# Patient Record
Sex: Male | Born: 1980 | Race: White | Hispanic: No | Marital: Married | Smoking: Former smoker
Health system: Southern US, Community
[De-identification: ages and names within clinical notes are randomized; demographics above are authoritative.]

## PROBLEM LIST (undated history)

## (undated) DIAGNOSIS — F419 Anxiety disorder, unspecified: Secondary | ICD-10-CM

## (undated) DIAGNOSIS — E079 Disorder of thyroid, unspecified: Secondary | ICD-10-CM

## (undated) DIAGNOSIS — J45909 Unspecified asthma, uncomplicated: Secondary | ICD-10-CM

## (undated) HISTORY — DX: Anxiety disorder, unspecified: F41.9

## (undated) HISTORY — PX: CHOLECYSTECTOMY: SHX55

## (undated) HISTORY — PX: KNEE SURGERY: SHX244

---

## 2003-01-04 ENCOUNTER — Ambulatory Visit (HOSPITAL_COMMUNITY): Admission: RE | Admit: 2003-01-04 | Discharge: 2003-01-04 | Payer: Self-pay | Admitting: Cardiology

## 2004-10-05 ENCOUNTER — Ambulatory Visit (HOSPITAL_COMMUNITY): Admission: RE | Admit: 2004-10-05 | Discharge: 2004-10-05 | Payer: Self-pay | Admitting: Internal Medicine

## 2004-10-13 ENCOUNTER — Ambulatory Visit: Payer: Self-pay | Admitting: Internal Medicine

## 2004-10-21 ENCOUNTER — Observation Stay (HOSPITAL_COMMUNITY): Admission: RE | Admit: 2004-10-21 | Discharge: 2004-10-22 | Payer: Self-pay | Admitting: General Surgery

## 2008-06-20 ENCOUNTER — Ambulatory Visit (HOSPITAL_COMMUNITY): Admission: RE | Admit: 2008-06-20 | Discharge: 2008-06-20 | Payer: Self-pay | Admitting: Family Medicine

## 2011-04-23 NOTE — Op Note (Signed)
NAME:  Derek Carson, Derek Carson NO.:  [id number]   MEDICAL RECORD NO.:  [id number]          PATIENT TYPE:  AMB   LOCATION:  DAY                           FACILITY:  [facility]   PHYSICIAN:  Barbaraann Barthel, M.D. DATE OF BIRTH:  02/02/1981   DATE OF PROCEDURE:  10/21/2004  DATE OF DISCHARGE:                                 OPERATIVE REPORT   PREOPERATIVE DIAGNOSIS:  Cholecystitis secondary to biliary dyskinesia.   POSTOPERATIVE DIAGNOSIS:  Cholecystitis secondary to biliary dyskinesia.   PROCEDURE:  Laparoscopic cholecystectomy.   SURGEON:  Barbaraann Barthel, M.D.   SPECIMENS:  Gallbladder.   NOTE:  This is a 30 year old white male who was referred from the GI service  for continuing right upper quadrant postprandial pain with nausea and  occasional bouts of vomiting as well.  He had undergone a thorough upper GI  evaluation including endoscopy, sonogram, and hepatobiliary scan.  The  suggestion was with a low ejection fraction to be biliary dyskinesia.  We  had tried diet manipulation and a nonoperative approach.  This had failed.  He was desirous of surgery.   We discussed complications not limited to but including bleeding, infection,  damage to bile ducts, perforation of organs, transitory diarrhea, and the  possibility that he still might have some dyspepsia as the result for  cholecystectomy for biliary dyskinesia are not as satisfying as for  cholecystectomy for cholelithiasis.  This was discussed in detail, and  informed consent was obtained.   GROSS OPERATIVE FINDINGS:  The patient had moderate adhesions around the  gallbladder, a small cystic duct which was not cannulated.  No stones noted  within the gallbladder.  No other abnormalities encountered in the right  upper quadrant.   TECHNIQUE:  The patient was placed in the supine position.  After the  adequate administration of general anesthesia by endotracheal intubation,  his entire abdomen was prepped with  Betadine solution and draped in the  usual manner.  Prior to this, a Foley catheter was aseptically inserted.  A  periumbilical incision was carried out over the superior aspect of the  umbilicus.  The fascia was grasped with a sharp towel clip and elevated, and  a Veress needle was inserted and confirmed the position with a saline drop  test.  We then used a Visiport technique to place the 11 mm cannula in the  umbilicus, and then under direct vision we placed three other cannulas, an  11 mm cannula in the epigastrium and two 5 mm cannulas in the right upper  quadrant laterally.  The gallbladder was grasped, its adhesions were taken  down.  The cystic duct was clearly visualized, as was the cystic artery.  These were doubly clamped, and the cystic duct and the cystic artery were  then after being identified and clamped, divided.  The gallbladder was then  removed uneventfully using the hook cautery device.  Prior to closure all  sponge, needle, and instrument counts were found to be correct.  We removed  the gallbladder using the hook cautery device using an Endosac device to  remove this through the  epigastric incision.  We then checked for hemostasis  and after irrigating and checking for hemostasis, I elected to leave a piece  of Surgicel in the liver bed and the abdomen was then desufflated and the 11  mm cannula sites were closed with an 0 Polysorb and we used 0.5% Sensorcaine  to infiltrate to help with postoperative discomfort.  Surgiclips were then  used to approximate the skin.  Prior to closure all sponge, needle, and  instrument counts were found to be correct.  Estimated blood loss was  minimal.  The patient received 1500 mL of crystalloids intraoperatively.  No  drains were placed.  There were no complications.     Will   WB/MEDQ  D:  10/21/2004  T:  10/21/2004  Job:  [id number]   cc:   R. Roetta Sessions, M.D.  [address]  [place]  [place] [address]  Fax: [phone]

## 2013-10-01 ENCOUNTER — Other Ambulatory Visit (HOSPITAL_COMMUNITY)
Admission: RE | Admit: 2013-10-01 | Discharge: 2013-10-01 | Disposition: A | Discharge status: Home / Self Care | Payer: BC Managed Care – PPO | Source: Ambulatory Visit | Attending: Urology | Admitting: Urology

## 2013-10-01 DIAGNOSIS — Z9852 Vasectomy status: Secondary | ICD-10-CM | POA: Insufficient documentation

## 2018-05-31 DIAGNOSIS — Z6839 Body mass index (BMI) 39.0-39.9, adult: Secondary | ICD-10-CM | POA: Diagnosis not present

## 2018-05-31 DIAGNOSIS — E039 Hypothyroidism, unspecified: Secondary | ICD-10-CM | POA: Diagnosis not present

## 2018-05-31 DIAGNOSIS — Z6841 Body Mass Index (BMI) 40.0 and over, adult: Secondary | ICD-10-CM | POA: Diagnosis not present

## 2018-05-31 DIAGNOSIS — R42 Dizziness and giddiness: Secondary | ICD-10-CM | POA: Diagnosis not present

## 2018-07-10 DIAGNOSIS — E039 Hypothyroidism, unspecified: Secondary | ICD-10-CM | POA: Diagnosis not present

## 2019-02-07 ENCOUNTER — Encounter (HOSPITAL_COMMUNITY): Payer: Self-pay

## 2019-02-07 ENCOUNTER — Emergency Department (HOSPITAL_COMMUNITY)
Admission: EM | Admit: 2019-02-07 | Discharge: 2019-02-07 | Disposition: A | Discharge status: Home / Self Care | Payer: BLUE CROSS/BLUE SHIELD | Attending: Emergency Medicine | Admitting: Emergency Medicine

## 2019-02-07 ENCOUNTER — Emergency Department (HOSPITAL_COMMUNITY): Payer: BLUE CROSS/BLUE SHIELD

## 2019-02-07 ENCOUNTER — Other Ambulatory Visit: Payer: Self-pay

## 2019-02-07 DIAGNOSIS — R42 Dizziness and giddiness: Secondary | ICD-10-CM | POA: Diagnosis not present

## 2019-02-07 DIAGNOSIS — E039 Hypothyroidism, unspecified: Secondary | ICD-10-CM | POA: Insufficient documentation

## 2019-02-07 DIAGNOSIS — Z79899 Other long term (current) drug therapy: Secondary | ICD-10-CM | POA: Insufficient documentation

## 2019-02-07 DIAGNOSIS — H814 Vertigo of central origin: Secondary | ICD-10-CM | POA: Diagnosis not present

## 2019-02-07 HISTORY — DX: Disorder of thyroid, unspecified: E07.9

## 2019-02-07 LAB — CBC WITH DIFFERENTIAL/PLATELET
Abs Immature Granulocytes: 0.01 10*3/uL (ref 0.00–0.07)
Basophils Absolute: 0 10*3/uL (ref 0.0–0.1)
Basophils Relative: 1 %
Eosinophils Absolute: 0.1 10*3/uL (ref 0.0–0.5)
Eosinophils Relative: 2 %
HCT: 46.1 % (ref 39.0–52.0)
Hemoglobin: 14.8 g/dL (ref 13.0–17.0)
Immature Granulocytes: 0 %
Lymphocytes Relative: 24 %
Lymphs Abs: 1.4 10*3/uL (ref 0.7–4.0)
MCH: 28.2 pg (ref 26.0–34.0)
MCHC: 32.1 g/dL (ref 30.0–36.0)
MCV: 88 fL (ref 80.0–100.0)
Monocytes Absolute: 0.5 10*3/uL (ref 0.1–1.0)
Monocytes Relative: 8 %
Neutro Abs: 3.7 10*3/uL (ref 1.7–7.7)
Neutrophils Relative %: 65 %
Platelets: 376 10*3/uL (ref 150–400)
RBC: 5.24 MIL/uL (ref 4.22–5.81)
RDW: 13.5 % (ref 11.5–15.5)
WBC: 5.6 10*3/uL (ref 4.0–10.5)
nRBC: 0 % (ref 0.0–0.2)

## 2019-02-07 LAB — COMPREHENSIVE METABOLIC PANEL
ALT: 26 U/L (ref 0–44)
AST: 20 U/L (ref 15–41)
Albumin: 3.9 g/dL (ref 3.5–5.0)
Alkaline Phosphatase: 92 U/L (ref 38–126)
Anion gap: 7 (ref 5–15)
BUN: 14 mg/dL (ref 6–20)
CO2: 23 mmol/L (ref 22–32)
Calcium: 8.8 mg/dL — ABNORMAL LOW (ref 8.9–10.3)
Chloride: 107 mmol/L (ref 98–111)
Creatinine, Ser: 0.81 mg/dL (ref 0.61–1.24)
GFR calc Af Amer: >60 mL/min (ref >60)
GFR calc non Af Amer: >60 mL/min (ref >60)
Glucose, Bld: 123 mg/dL — ABNORMAL HIGH (ref 70–99)
Potassium: 4.1 mmol/L (ref 3.5–5.1)
Sodium: 137 mmol/L (ref 135–145)
Total Bilirubin: 0.6 mg/dL (ref 0.3–1.2)
Total Protein: 7 g/dL (ref 6.5–8.1)

## 2019-02-07 LAB — TSH: TSH: 3.316 u[IU]/mL (ref 0.350–4.500)

## 2019-02-07 LAB — TROPONIN I: Troponin I: <0.03 ng/mL (ref <0.03)

## 2019-02-07 MED ORDER — MECLIZINE HCL 12.5 MG PO TABS
25.0000 mg | ORAL_TABLET | Freq: Once | ORAL | Status: AC
  Administered 2019-02-07: 25 mg via ORAL
  Filled 2019-02-07: qty 2

## 2019-02-07 MED ORDER — ONDANSETRON 4 MG PO TBDP
4.0000 mg | ORAL_TABLET | Freq: Once | ORAL | Status: AC
  Administered 2019-02-07: 4 mg via ORAL
  Filled 2019-02-07: qty 1

## 2019-02-07 MED ORDER — MECLIZINE HCL 25 MG PO TABS: 25.0000 mg | ORAL_TABLET | Freq: Three times a day (TID) | ORAL | 0 refills | Status: DC | PRN

## 2019-02-07 NOTE — ED Triage Notes (Signed)
Pt reports he woke up 545 am and was dizzy and dizziness has gotten worse. Feels like room spinning. Denies HA  Feels nauseated. Wife reports that he had difficulty telling registration his DOB and mothers phone number

## 2019-02-07 NOTE — Discharge Instructions (Signed)
Return if any problems.

## 2019-02-07 NOTE — ED Provider Notes (Signed)
[facility] EMERGENCY DEPARTMENT Provider Note   CSN: [id number] Arrival date & time: 02/07/19  1157    History   Chief Complaint Chief Complaint  Patient presents with  . Dizziness    HPI Derek Carson is a 38 y.o. male.     The history is provided by the patient. No language interpreter was used.  Dizziness  Quality:  Lightheadedness, vertigo and room spinning Severity:  Severe Onset quality:  Sudden Duration:  4 hours Timing:  Constant Progression:  Worsening Chronicity:  Recurrent Relieved by:  Nothing Worsened by:  Nothing Ineffective treatments:  None tried Associated symptoms: nausea   Associated symptoms: no chest pain   Risk factors: no new medications   Pt reports he awoke this morning with dizziness.  Pt reports he had similar in the past and his thyroid was low.  Pt reports symptoms resolved.  Wife reports pt has had difficulty with communicating this am.  Pt could not remember his Mothers phone number.  He could not give registration his date of birth.   Past Medical History:  Diagnosis Date  . Thyroid disease    hypothyroid    There are no active problems to display for this patient.   History reviewed. No pertinent surgical history.      Home Medications    Prior to Admission medications   Medication Sig Start Date End Date Taking? Authorizing Provider  levothyroxine (SYNTHROID, LEVOTHROID) 88 MCG tablet Take 1 mcg by mouth daily.  01/19/19  Yes [provider]    Family History No family history on file.  Social History Social History   Tobacco Use  . Smoking status: Never Smoker  Substance Use Topics  . Alcohol use: Not Currently  . Drug use: Never     Allergies   Codeine and Phenergan [promethazine hcl]   Review of Systems Review of Systems  Cardiovascular: Negative for chest pain.  Gastrointestinal: Positive for nausea.  Neurological: Positive for dizziness.  All other systems reviewed and are  negative.    Physical Exam Updated Vital Signs BP 132/78   Pulse 69   Temp 98.2 F (36.8 C) (Oral)   Resp 18   Ht 6' (1.829 m)   Wt (!) 138.3 kg   SpO2 99%   BMI 41.37 kg/m   Physical Exam Vitals signs and nursing note reviewed.  Constitutional:      Appearance: He is well-developed.  HENT:     Head: Normocephalic and atraumatic.     Right Ear: Tympanic membrane and ear canal normal.     Left Ear: Tympanic membrane and ear canal normal.     Nose: Nose normal.     Mouth/Throat:     Mouth: Mucous membranes are moist.  Eyes:     Extraocular Movements: Extraocular movements intact.     Conjunctiva/sclera: Conjunctivae normal.     Pupils: Pupils are equal, round, and reactive to light.     Comments: No nystagmus   Neck:     Musculoskeletal: Neck supple.  Cardiovascular:     Rate and Rhythm: Normal rate and regular rhythm.     Heart sounds: No murmur.  Pulmonary:     Effort: Pulmonary effort is normal. No respiratory distress.     Breath sounds: Normal breath sounds.  Abdominal:     Palpations: Abdomen is soft.     Tenderness: There is no abdominal tenderness.  Skin:    General: Skin is warm and dry.  Neurological:  General: No focal deficit present.     Mental Status: He is alert and oriented to person, place, and time.     Cranial Nerves: No cranial nerve deficit.     Sensory: No sensory deficit.     Motor: No weakness.  Psychiatric:        Mood and Affect: Mood normal.      ED Treatments / Results  Labs (all labs ordered are listed, but only abnormal results are displayed) Labs Reviewed  COMPREHENSIVE METABOLIC PANEL - Abnormal; Notable for the following components:      Result Value   Glucose, Bld 123 (*)    Calcium 8.8 (*)    All other components within normal limits  CBC WITH DIFFERENTIAL/PLATELET  TROPONIN I  TSH    EKG None  Radiology Mr Brain Wo Contrast  Result Date: 02/07/2019 CLINICAL DATA:  Persistent central vertigo.  Headache  and dizziness EXAM: MRI HEAD WITHOUT CONTRAST TECHNIQUE: Multiplanar, multiecho pulse sequences of the brain and surrounding structures were obtained without intravenous contrast. COMPARISON:  CT head 11/23/2017 FINDINGS: Brain: The patient was not able to complete the study. Mild motion degraded study. Axial T1 and coronal T2 images not performed. Ventricle size normal. Negative for acute infarct. Negative for hemorrhage or mass. No significant chronic ischemia. Vascular: Normal arterial flow voids. Skull and upper cervical spine: Negative Sinuses/Orbits: Negative Other: None IMPRESSION: Negative MRI head. The patient was not able to complete all sequences. Electronically Signed   By: Marlan Palau M.D.   On: 02/07/2019 11:16    Procedures Procedures (including critical care time)  Medications Ordered in ED Medications  ondansetron (ZOFRAN-ODT) disintegrating tablet 4 mg (4 mg Oral Given 02/07/19 1029)  meclizine (ANTIVERT) tablet 25 mg (25 mg Oral Given 02/07/19 1030)     Initial Impression / Assessment and Plan / ED Course  I have reviewed the triage vital signs and the nursing notes.  Pertinent labs & imaging results that were available during my care of the patient were reviewed by me and considered in my medical decision making (see chart for details).        Pt reports no relief with zofran and antivert.  Pt reports anxiety while getting MRI.   Pt reports he felt itchy and had a rash on his chest.  (Pt has some irritation to his chest where EKG leads and hair has been shaved.   Final Clinical Impressions(s) / ED Diagnoses   Final diagnoses:  Dizziness  Vertigo    ED Discharge Orders         Ordered    meclizine (ANTIVERT) 25 MG tablet  3 times daily PRN     02/07/19 1210        An After Visit Summary was printed and given to the patient.    Osie Cheeks 02/07/19 1536    Benjiman Core, MD 02/08/19 [phone]

## 2019-02-08 DIAGNOSIS — R42 Dizziness and giddiness: Secondary | ICD-10-CM | POA: Diagnosis not present

## 2019-02-08 DIAGNOSIS — Z1331 Encounter for screening for depression: Secondary | ICD-10-CM | POA: Diagnosis not present

## 2019-02-08 DIAGNOSIS — Z6841 Body Mass Index (BMI) 40.0 and over, adult: Secondary | ICD-10-CM | POA: Diagnosis not present

## 2019-07-10 ENCOUNTER — Other Ambulatory Visit: Payer: Self-pay

## 2019-07-10 DIAGNOSIS — Z20822 Contact with and (suspected) exposure to covid-19: Secondary | ICD-10-CM

## 2019-07-11 LAB — NOVEL CORONAVIRUS, NAA: SARS-CoV-2, NAA: DETECTED — AB

## 2022-02-08 ENCOUNTER — Encounter (HOSPITAL_COMMUNITY): Payer: Self-pay

## 2022-02-08 ENCOUNTER — Emergency Department (HOSPITAL_COMMUNITY): Payer: Self-pay

## 2022-02-08 ENCOUNTER — Emergency Department (HOSPITAL_COMMUNITY)
Admission: EM | Admit: 2022-02-08 | Discharge: 2022-02-08 | Disposition: A | Discharge status: Home / Self Care | Payer: Self-pay | Attending: Emergency Medicine | Admitting: Emergency Medicine

## 2022-02-08 ENCOUNTER — Other Ambulatory Visit: Payer: Self-pay

## 2022-02-08 DIAGNOSIS — R55 Syncope and collapse: Secondary | ICD-10-CM | POA: Insufficient documentation

## 2022-02-08 HISTORY — DX: Unspecified asthma, uncomplicated: J45.909

## 2022-02-08 LAB — URINALYSIS, ROUTINE W REFLEX MICROSCOPIC
Bilirubin Urine: NEGATIVE
Glucose, UA: NEGATIVE mg/dL
Hgb urine dipstick: NEGATIVE
Ketones, ur: NEGATIVE mg/dL
Leukocytes,Ua: NEGATIVE
Nitrite: NEGATIVE
Protein, ur: NEGATIVE mg/dL
Specific Gravity, Urine: 1.011 (ref 1.005–1.030)
pH: 7 (ref 5.0–8.0)

## 2022-02-08 LAB — CBC
HCT: 45.2 % (ref 39.0–52.0)
Hemoglobin: 15.2 g/dL (ref 13.0–17.0)
MCH: 29.1 pg (ref 26.0–34.0)
MCHC: 33.6 g/dL (ref 30.0–36.0)
MCV: 86.6 fL (ref 80.0–100.0)
Platelets: 341 10*3/uL (ref 150–400)
RBC: 5.22 MIL/uL (ref 4.22–5.81)
RDW: 12.5 % (ref 11.5–15.5)
WBC: 7.1 10*3/uL (ref 4.0–10.5)
nRBC: 0 % (ref 0.0–0.2)

## 2022-02-08 LAB — HEPATIC FUNCTION PANEL
ALT: 51 U/L — ABNORMAL HIGH (ref 0–44)
AST: 36 U/L (ref 15–41)
Albumin: 3.9 g/dL (ref 3.5–5.0)
Alkaline Phosphatase: 74 U/L (ref 38–126)
Bilirubin, Direct: 0.2 mg/dL (ref 0.0–0.2)
Indirect Bilirubin: 0.5 mg/dL (ref 0.3–0.9)
Total Bilirubin: 0.7 mg/dL (ref 0.3–1.2)
Total Protein: 7.2 g/dL (ref 6.5–8.1)

## 2022-02-08 LAB — BASIC METABOLIC PANEL
Anion gap: 8 (ref 5–15)
BUN: 12 mg/dL (ref 6–20)
CO2: 22 mmol/L (ref 22–32)
Calcium: 8.9 mg/dL (ref 8.9–10.3)
Chloride: 104 mmol/L (ref 98–111)
Creatinine, Ser: 0.9 mg/dL (ref 0.61–1.24)
GFR, Estimated: >60 mL/min (ref >60)
Glucose, Bld: 125 mg/dL — ABNORMAL HIGH (ref 70–99)
Potassium: 3.7 mmol/L (ref 3.5–5.1)
Sodium: 134 mmol/L — ABNORMAL LOW (ref 135–145)

## 2022-02-08 LAB — TROPONIN I (HIGH SENSITIVITY)
Troponin I (High Sensitivity): 3 ng/L (ref <18)
Troponin I (High Sensitivity): 3 ng/L (ref <18)

## 2022-02-08 LAB — CBG MONITORING, ED: Glucose-Capillary: 113 mg/dL — ABNORMAL HIGH (ref 70–99)

## 2022-02-08 LAB — TSH: TSH: 7.147 u[IU]/mL — ABNORMAL HIGH (ref 0.350–4.500)

## 2022-02-08 NOTE — ED Notes (Signed)
ED Provider at bedside. 

## 2022-02-08 NOTE — Discharge Instructions (Signed)
Follow-up with your family doctor this week as planned.  No driving until they see you.  Return if problem ?

## 2022-02-08 NOTE — ED Triage Notes (Signed)
Pt with syncopal episode at work, not witnessed, unsure of how long he was out. Pt was at work and was found. NSR on 12 lead, CBG 119, BP 123/81. Pt dizzy upon standing with EMS.  ?

## 2022-02-08 NOTE — ED Provider Notes (Signed)
[facility] EMERGENCY DEPARTMENT Provider Note   CSN: [id number] Arrival date & time: 02/08/22  7902     History  Chief Complaint  Patient presents with   Loss of Consciousness    Derek Carson is a 41 y.o. male.  Patient states he had a syncopal episode at work.  He did not have any dizziness.  Patient is on thyroid medicine.  Patient also states he has been having a lot of anxiety  The history is provided by the patient and medical records.  Loss of Consciousness Episode history:  Single Most recent episode:  Today Progression:  Resolved Chronicity:  New Context: not blood draw   Witnessed: no   Relieved by:  Nothing Worsened by:  Nothing Ineffective treatments:  None tried Associated symptoms: anxiety   Associated symptoms: no chest pain, no headaches and no seizures   Risk factors: no congenital heart disease       Home Medications Prior to Admission medications   Medication Sig Start Date End Date Taking? Authorizing Provider  levothyroxine (SYNTHROID, LEVOTHROID) 88 MCG tablet Take 1 mcg by mouth daily.  01/19/19   [provider]  meclizine (ANTIVERT) 25 MG tablet Take 1 tablet (25 mg total) by mouth 3 (three) times daily as needed for dizziness. 02/07/19   Elson Areas, PA-C      Allergies    Codeine and Phenergan [promethazine hcl]    Review of Systems   Review of Systems  Constitutional:  Negative for appetite change and fatigue.  HENT:  Negative for congestion, ear discharge and sinus pressure.   Eyes:  Negative for discharge.  Respiratory:  Negative for cough.   Cardiovascular:  Positive for syncope. Negative for chest pain.  Gastrointestinal:  Negative for abdominal pain and diarrhea.  Genitourinary:  Negative for frequency and hematuria.  Musculoskeletal:  Negative for back pain.  Skin:  Negative for rash.  Neurological:  Positive for syncope. Negative for seizures and headaches.  Psychiatric/Behavioral:  Negative for hallucinations.     Physical Exam Updated Vital Signs BP 131/86    Pulse 72    Temp 98.5 F (36.9 C) (Oral)    Resp 19    Ht 5\' 11"  (1.803 m)    Wt (!) 145.2 kg    SpO2 100%    BMI 44.63 kg/m  Physical Exam Vitals and nursing note reviewed.  Constitutional:      Appearance: He is well-developed.  HENT:     Head: Normocephalic.     Nose: Nose normal.  Eyes:     General: No scleral icterus.    Conjunctiva/sclera: Conjunctivae normal.  Neck:     Thyroid: No thyromegaly.  Cardiovascular:     Rate and Rhythm: Normal rate and regular rhythm.     Heart sounds: No murmur heard.   No friction rub. No gallop.  Pulmonary:     Breath sounds: No stridor. No wheezing or rales.  Chest:     Chest wall: No tenderness.  Abdominal:     General: There is no distension.     Tenderness: There is no abdominal tenderness. There is no rebound.  Musculoskeletal:        General: Normal range of motion.     Cervical back: Neck supple.  Lymphadenopathy:     Cervical: No cervical adenopathy.  Skin:    Findings: No erythema or rash.  Neurological:     Mental Status: He is alert and oriented to person, place, and time.  Motor: No abnormal muscle tone.     Coordination: Coordination normal.  Psychiatric:        Behavior: Behavior normal.    ED Results / Procedures / Treatments   Labs (all labs ordered are listed, but only abnormal results are displayed) Labs Reviewed  BASIC METABOLIC PANEL - Abnormal; Notable for the following components:      Result Value   Sodium 134 (*)    Glucose, Bld 125 (*)    All other components within normal limits  HEPATIC FUNCTION PANEL - Abnormal; Notable for the following components:   ALT 51 (*)    All other components within normal limits  TSH - Abnormal; Notable for the following components:   TSH 7.147 (*)    All other components within normal limits  CBG MONITORING, ED - Abnormal; Notable for the following components:   Glucose-Capillary 113 (*)    All other  components within normal limits  CBC  URINALYSIS, ROUTINE W REFLEX MICROSCOPIC  TROPONIN I (HIGH SENSITIVITY)  TROPONIN I (HIGH SENSITIVITY)    EKG EKG Interpretation  Date/Time:  Monday February 08 2022 08:38:34 EST Ventricular Rate:  78 PR Interval:  138 QRS Duration: 92 QT Interval:  357 QTC Calculation: 407 R Axis:   22 Text Interpretation: Sinus rhythm Abnormal R-wave progression, early transition Confirmed by Bethann Berkshire [phone]) on 02/08/2022 12:07:11 PM  Radiology CT Head Wo Contrast  Result Date: 02/08/2022 CLINICAL DATA:  Dizziness.  Found unresponsive at work. EXAM: CT HEAD WITHOUT CONTRAST TECHNIQUE: Contiguous axial images were obtained from the base of the skull through the vertex without intravenous contrast. RADIATION DOSE REDUCTION: This exam was performed according to the departmental dose-optimization program which includes automated exposure control, adjustment of the mA and/or kV according to patient size and/or use of iterative reconstruction technique. COMPARISON:  02/07/2019 brain MR. Report of 11/23/2017 head CT from [facility]. FINDINGS: Brain: No mass lesion, hemorrhage, hydrocephalus, acute infarct, intra-axial, or extra-axial fluid collection. Vascular: No hyperdense vessel or unexpected calcification. Skull: No significant soft tissue swelling.  No skull fracture. Sinuses/Orbits: Normal imaged portions of the orbits and globes. Hypoplastic right frontal sinus. Clear mastoid air cells. Other: None. IMPRESSION: Normal head CT. Electronically Signed   By: Jeronimo Greaves M.D.   On: 02/08/2022 09:25   DG Chest Port 1 View  Result Date: 02/08/2022 CLINICAL DATA:  Weakness, syncope episode at work. EXAM: PORTABLE CHEST 1 VIEW COMPARISON:  Chest radiograph dated February 01, 2022 FINDINGS: The heart size and mediastinal contours are within normal limits. Both lungs are clear. The visualized skeletal structures are unremarkable. IMPRESSION: No active disease.  Electronically Signed   By: Larose Hires D.O.   On: 02/08/2022 09:12    Procedures Procedures    Medications Ordered in ED Medications - No data to display  ED Course/ Medical Decision Making/ A&P                           Medical Decision Making Amount and/or Complexity of Data Reviewed Labs: ordered. Radiology: ordered.  This patient presents to the ED for concern of syncope, this involves an extensive number of treatment options, and is a complaint that carries with it a high risk of complications and morbidity.  The differential diagnosis includes anxiety, dehydration,   Co morbidities that complicate the patient evaluation  Thyroid disease   Additional history obtained:  Additional history obtained from wife External records from outside source obtained and  reviewed including hospital record   Lab Tests:  I Ordered, and personally interpreted labs.  The pertinent results include: CBC and chemistries which were unremarkable   Imaging Studies ordered:  I ordered imaging studies including chest x-ray I independently visualized and interpreted imaging which showed negative I agree with the radiologist interpretation   Cardiac Monitoring:  The patient was maintained on a cardiac monitor.  I personally viewed and interpreted the cardiac monitored which showed an underlying rhythm of: Normal sinus rhythm   Medicines ordered and prescription drug management:  I ordered medication including normal saline for dehydration Reevaluation of the patient after these medicines showed that the patient improved I have reviewed the patients home medicines and have made adjustments as needed   Test Considered:  None   Critical Interventions:  None   Consultations Obtained:  No consult  Problem List / ED Course:  Syncope    Social Determinants of Health:  None  Patient with syncope and normal labs chest x-ray and CT head.  Possibly related to anxiety.  He  will follow-up with his PCP check        Final Clinical Impression(s) / ED Diagnoses Final diagnoses:  Syncope and collapse    Rx / DC Orders ED Discharge Orders     None         Bethann Berkshire, MD 02/09/22 1746

## 2022-02-08 NOTE — ED Notes (Signed)
Pt given glass of water.  

## 2022-08-10 ENCOUNTER — Encounter (HOSPITAL_COMMUNITY): Payer: Self-pay | Admitting: Emergency Medicine

## 2022-08-10 ENCOUNTER — Emergency Department (HOSPITAL_COMMUNITY)
Admission: EM | Admit: 2022-08-10 | Discharge: 2022-08-10 | Disposition: A | Discharge status: Home / Self Care | Payer: No Typology Code available for payment source | Attending: Emergency Medicine | Admitting: Emergency Medicine

## 2022-08-10 ENCOUNTER — Emergency Department (HOSPITAL_COMMUNITY): Payer: No Typology Code available for payment source

## 2022-08-10 ENCOUNTER — Other Ambulatory Visit: Payer: Self-pay

## 2022-08-10 DIAGNOSIS — J029 Acute pharyngitis, unspecified: Secondary | ICD-10-CM | POA: Insufficient documentation

## 2022-08-10 LAB — COMPREHENSIVE METABOLIC PANEL
ALT: 30 U/L (ref 0–44)
AST: 23 U/L (ref 15–41)
Albumin: 4.2 g/dL (ref 3.5–5.0)
Alkaline Phosphatase: 83 U/L (ref 38–126)
Anion gap: 6 (ref 5–15)
BUN: 14 mg/dL (ref 6–20)
CO2: 28 mmol/L (ref 22–32)
Calcium: 9.1 mg/dL (ref 8.9–10.3)
Chloride: 104 mmol/L (ref 98–111)
Creatinine, Ser: 0.82 mg/dL (ref 0.61–1.24)
GFR, Estimated: >60 mL/min (ref >60)
Glucose, Bld: 117 mg/dL — ABNORMAL HIGH (ref 70–99)
Potassium: 3.7 mmol/L (ref 3.5–5.1)
Sodium: 138 mmol/L (ref 135–145)
Total Bilirubin: 0.8 mg/dL (ref 0.3–1.2)
Total Protein: 7.6 g/dL (ref 6.5–8.1)

## 2022-08-10 LAB — CBC WITH DIFFERENTIAL/PLATELET
Abs Immature Granulocytes: 0.03 10*3/uL (ref 0.00–0.07)
Basophils Absolute: 0 10*3/uL (ref 0.0–0.1)
Basophils Relative: 0 %
Eosinophils Absolute: 0 10*3/uL (ref 0.0–0.5)
Eosinophils Relative: 0 %
HCT: 48.7 % (ref 39.0–52.0)
Hemoglobin: 16.2 g/dL (ref 13.0–17.0)
Immature Granulocytes: 0 %
Lymphocytes Relative: 16 %
Lymphs Abs: 1.5 10*3/uL (ref 0.7–4.0)
MCH: 29.8 pg (ref 26.0–34.0)
MCHC: 33.3 g/dL (ref 30.0–36.0)
MCV: 89.7 fL (ref 80.0–100.0)
Monocytes Absolute: 0.4 10*3/uL (ref 0.1–1.0)
Monocytes Relative: 4 %
Neutro Abs: 7.3 10*3/uL (ref 1.7–7.7)
Neutrophils Relative %: 80 %
Platelets: 390 10*3/uL (ref 150–400)
RBC: 5.43 MIL/uL (ref 4.22–5.81)
RDW: 13 % (ref 11.5–15.5)
WBC: 9.2 10*3/uL (ref 4.0–10.5)
nRBC: 0 % (ref 0.0–0.2)

## 2022-08-10 MED ORDER — IOHEXOL 300 MG/ML  SOLN
100.0000 mL | Freq: Once | INTRAMUSCULAR | Status: AC | PRN
  Administered 2022-08-10: 75 mL via INTRAVENOUS

## 2022-08-10 NOTE — ED Provider Notes (Signed)
[facility] EMERGENCY DEPARTMENT Provider Note   CSN: [id number] Arrival date & time: 08/10/22  7017     History  Chief Complaint  Patient presents with   Sore Throat    Derek Carson is a 41 y.o. male.   Sore Throat   41 year old male presents emergency department with complaints of difficulty swallowing, difficulty breathing as well as change in voice.  Patient states that symptoms have been progressively worsening since this past Thursday.  He has a known nontoxic uninodular goiter.  He was seen by his PCP this past Thursday who increased his levothyroxine the patient states has not helped his symptoms.  He states he has been able to eat and drink but feels like it is getting stuck.  He notes some difficulty breathing but is communicating without difficulty during HPI.  Denies fever, chills, night sweats, cough, congestion, chest pain, shortness of breath, nausea, vomiting.  Past medical history significant for thyroid disease, asthma  Home Medications Prior to Admission medications   Medication Sig Start Date End Date Taking? Authorizing Provider  levothyroxine (SYNTHROID, LEVOTHROID) 88 MCG tablet Take 1 mcg by mouth daily.  01/19/19   [provider]  meclizine (ANTIVERT) 25 MG tablet Take 1 tablet (25 mg total) by mouth 3 (three) times daily as needed for dizziness. 02/07/19   Elson Areas, PA-C      Allergies    Codeine and Phenergan [promethazine hcl]    Review of Systems   Review of Systems  All other systems reviewed and are negative.   Physical Exam Updated Vital Signs BP (!) 154/94 (BP Location: Right Arm)   Pulse 91   Temp 98.1 F (36.7 C) (Oral)   Resp 18   SpO2 99%  Physical Exam Vitals and nursing note reviewed.  Constitutional:      General: He is not in acute distress.    Appearance: He is well-developed. He is not ill-appearing.     Comments: Patient in no acute respiratory distress tolerating oral secretions without difficulty.   HENT:     Head: Normocephalic and atraumatic.     Nose: No congestion or rhinorrhea.     Mouth/Throat:     Mouth: Mucous membranes are moist. No oral lesions.     Pharynx: Oropharynx is clear. Uvula midline. No pharyngeal swelling, oropharyngeal exudate, posterior oropharyngeal erythema or uvula swelling.     Tonsils: No tonsillar exudate or tonsillar abscesses.     Comments: Uvula midline and rises symmetrically with phonation.  Tonsils 0+ bilaterally.  No obvious exudate or erythema noted.  No obvious stridor upon auscultation of neck. Eyes:     Conjunctiva/sclera: Conjunctivae normal.  Cardiovascular:     Rate and Rhythm: Normal rate and regular rhythm.     Heart sounds: No murmur heard. Pulmonary:     Effort: Pulmonary effort is normal. No respiratory distress.     Breath sounds: Normal breath sounds. No wheezing or rales.  Abdominal:     Palpations: Abdomen is soft.     Tenderness: There is no abdominal tenderness.  Musculoskeletal:        General: No swelling.     Cervical back: Normal range of motion and neck supple.  Lymphadenopathy:     Cervical: No cervical adenopathy.  Skin:    General: Skin is warm and dry.     Capillary Refill: Capillary refill takes less than 2 seconds.  Neurological:     Mental Status: He is alert.  Psychiatric:  Mood and Affect: Mood normal.     ED Results / Procedures / Treatments   Labs (all labs ordered are listed, but only abnormal results are displayed) Labs Reviewed  COMPREHENSIVE METABOLIC PANEL - Abnormal; Notable for the following components:      Result Value   Glucose, Bld 117 (*)    All other components within normal limits  CBC WITH DIFFERENTIAL/PLATELET    EKG None  Radiology CT Soft Tissue Neck W Contrast  Result Date: 08/10/2022 CLINICAL DATA:  Known thyroid nodule. Difficulty swallowing, breathing, and talking. EXAM: CT NECK WITH CONTRAST TECHNIQUE: Multidetector CT imaging of the neck was performed using the  standard protocol following the bolus administration of intravenous contrast. RADIATION DOSE REDUCTION: This exam was performed according to the departmental dose-optimization program which includes automated exposure control, adjustment of the mA and/or kV according to patient size and/or use of iterative reconstruction technique. CONTRAST:  85mL OMNIPAQUE IOHEXOL 300 MG/ML  SOLN COMPARISON:  No direct comparison study available. Thyroid ultrasound 03/01/2022 FINDINGS: Pharynx and larynx: The nasal cavity and nasopharynx are unremarkable. The oral cavity and oropharynx are unremarkable. The parapharyngeal spaces are clear. The hypopharynx and larynx are unremarkable. The vocal folds are normal in appearance. There is a small retropharyngeal effusion without peripheral enhancement to suggest abscess formation. The airway is widely patent. Salivary glands: The parotid and submandibular glands are unremarkable. There is minimal stranding in the fat surrounding the right submandibular gland with no organized or drainable fluid collection. Thyroid: The right thyroid lobe is significantly enlarged with a 4.7 cm nodule common better evaluated by prior ultrasound and biopsied on 04/02/2022. The adjacent airway remains patent. Lymph nodes: There is no pathologic lymphadenopathy in the neck. Vascular: The major vasculature of the neck is unremarkable. Limited intracranial: The imaged portions of the intracranial compartment are unremarkable. Visualized orbits: The imaged globes and orbits are unremarkable. Mastoids and visualized paranasal sinuses: Clear. Skeleton: There is mild degenerative change of the cervical spine. There is no acute osseous abnormality or suspicious osseous lesion. Upper chest: The imaged lung apices are clear. Other: None. IMPRESSION: 1. Large right thyroid nodule measuring up to 4.7 cm, biopsied on 04/02/2022. Correlate with pathology results. The adjacent airway remains patent. 2. No pathologic  lymphadenopathy in the neck. 3. Small amount of retropharyngeal fluid without evidence of abscess formation, nonspecific. 4. Trace inflammatory fat stranding in the right submandibular region. The submandibular gland itself appears normal. Correlate with physical exam. Electronically Signed   By: Lesia Hausen M.D.   On: 08/10/2022 15:12    Procedures Procedures    Medications Ordered in ED Medications  iohexol (OMNIPAQUE) 300 MG/ML solution 100 mL (75 mLs Intravenous Contrast Given 08/10/22 1442)    ED Course/ Medical Decision Making/ A&P Clinical Course as of 08/10/22 1627  Tue Aug 10, 2022  1545 Stable 51 YOM with history of thyroid nodule and nontoxic goiter. Seen by PCP on levothyroxine. Ambulatory and tolerating PO intake. ENT referral OP neck mass.  [CC]    Clinical Course User Index [CC] Glyn Ade, MD                           Medical Decision Making Amount and/or Complexity of Data Reviewed Labs: ordered. Radiology: ordered.  Risk Prescription drug management.   This patient presents to the ED for concern of sore throat, this involves an extensive number of treatment options, and is a complaint that carries with it a  high risk of complications and morbidity.  The differential diagnosis includes retropharyngeal abscess, epiglottitis, mechanical compression from enlarged thyroid, peritonsillar abscess, uvulitis, periapical abscess, Ludwig angina, Lemierre's disease   Co morbidities that complicate the patient evaluation  See HPI   Additional history obtained:  Additional history obtained from EMR External records from outside source obtained and reviewed including prior ultrasound performed on 4/23 indicating size of thyroid nodule.   Lab Tests:  I Ordered, and personally interpreted labs.  The pertinent results include: No leukocytosis noted.  No evidence anemia.  Platelets within normal range.  No evidence of visual abnormality.  No transaminitis.  Renal  function within normal limits.   Imaging Studies ordered:  I ordered imaging studies including CT soft tissue neck I independently visualized and interpreted imaging which showed large right thyroid nodule measuring 4.7 cm.  No pathological lymphadenopathy in the neck.  Small amount of retropharyngeal fluid without evidence of abscess formation.  Trace inflammatory fat stranding in the right submandibular region.  Submandibular gland itself appears normal. I agree with the radiologist interpretation  Cardiac Monitoring: / EKG:  The patient was maintained on a cardiac monitor.  I personally viewed and interpreted the cardiac monitored which showed an underlying rhythm of: Sinus rhythm   Consultations Obtained:  N/a   Problem List / ED Course / Critical interventions / Medication management  Sore throat Reevaluation of the patient  showed that the patient improved I have reviewed the patients home medicines and have made adjustments as needed   Social Determinants of Health:  Former cigar use.  Denies illicit drug use.   Test / Admission - Considered:  Vitals signs significant for hypertension with a blood pressure 154/94.  Recommend close follow-up with PCP regarding elevation of blood pressure. Otherwise within normal range and stable throughout visit. Laboratory/imaging studies significant for: See above  Patient's airway patent and he has been able to tolerate both solid and liquid food upon reevaluation.  He tolerated both liquid and solid foods here in the emergency department.  He states that he had chicken last night.  Patient in no obvious respiratory distress with no evidence of stridor on exam.  Patient recommended outpatient follow-up with ENT.  I advised patient to eat small well chewed meals to avoid potential food bolus impaction given his current complaints.  Close follow-up with PCP recommended in 3 to 5 days for reevaluation of symptoms.  In the meantime, ENT  referral will be made from the emergency department.  Treatment plan discussed with patient and he knowledge understanding was agreeable to said plan. Worrisome signs and symptoms were discussed with the patient, and the patient acknowledged understanding to return to the ED if noticed. Patient was stable upon discharge.         Final Clinical Impression(s) / ED Diagnoses Final diagnoses:  Sore throat    Rx / DC Orders ED Discharge Orders     None         Peter Garter, [place] 08/10/22 1627    Jacalyn Lefevre, MD 08/11/22 [phone]

## 2022-08-10 NOTE — ED Triage Notes (Signed)
Pt has known nodule on thyroid. C/o trouble swallowing, breathing and talking. Pt is able to swallow. X 4 days. Nad. Pt denies pain, no oral swelling noted. A/o. Ambulatory. Color wnl.

## 2022-08-10 NOTE — ED Notes (Signed)
Patient transported to CT 

## 2022-08-10 NOTE — Discharge Instructions (Addendum)
Note that your work-up today was overall reassuring.  No evidence of abnormal findings on laboratory studies ordered.  Your imaging of your neck also looked reassuring with no evidence of compression on your airway or esophagus.  As we discussed, I have provided information to follow-up with ENT for reevaluation of your symptoms.  I will call the number soon as able to set up an appointment.  I also recommend follow-up with your primary care provider in 3 to 5 days to keep an eye on your symptoms to make sure they are improving and not getting worse.  Please not hesitate to return to the emergency department the worrisome signs and symptoms we discussed become apparent.

## 2022-09-09 ENCOUNTER — Ambulatory Visit (INDEPENDENT_AMBULATORY_CARE_PROVIDER_SITE_OTHER): Payer: No Typology Code available for payment source | Admitting: Gastroenterology

## 2022-09-09 ENCOUNTER — Encounter: Payer: Self-pay | Admitting: *Deleted

## 2022-09-09 ENCOUNTER — Encounter: Payer: Self-pay | Admitting: Gastroenterology

## 2022-09-09 VITALS — BP 151/92 | HR 99 | Temp 97.8°F | Ht 71.0 in | Wt 300.2 lb

## 2022-09-09 DIAGNOSIS — K219 Gastro-esophageal reflux disease without esophagitis: Secondary | ICD-10-CM

## 2022-09-09 DIAGNOSIS — K625 Hemorrhage of anus and rectum: Secondary | ICD-10-CM

## 2022-09-09 DIAGNOSIS — R1310 Dysphagia, unspecified: Secondary | ICD-10-CM

## 2022-09-09 DIAGNOSIS — D649 Anemia, unspecified: Secondary | ICD-10-CM | POA: Diagnosis not present

## 2022-09-09 DIAGNOSIS — K921 Melena: Secondary | ICD-10-CM

## 2022-09-09 MED ORDER — PANTOPRAZOLE SODIUM 20 MG PO TBEC: 20.0000 mg | DELAYED_RELEASE_TABLET | Freq: Two times a day (BID) | ORAL | 1 refills | Status: DC

## 2022-09-09 NOTE — Patient Instructions (Addendum)
We are scheduling you for a upper endoscopy and colonoscopy in the near future with Dr. Gala Romney or Dr. Abbey Chatters.   I am also ordering labs for you have completed at [place] to recheck your hemoglobin and assess for any iron deficiency.  You to continue taking your pantoprazole 20 mg twice daily.  Going forward you should avoid all NSAIDs including ibuprofen, Advil, Aleve, BC or Goody powders as well as any high-dose aspirin.  Follow a GERD diet:  Avoid fried, fatty, greasy, spicy, citrus foods. Avoid caffeine and carbonated beverages. Avoid chocolate. Try eating 4-6 small meals a day rather than 3 large meals. Do not eat within 3 hours of laying down. Prop head of bed up on wood or bricks to create a 6 inch incline.  Follow-up to be determined after procedures  It was a pleasure to see you today. I want to create trusting relationships with patients. If you receive a survey regarding your visit,  I greatly appreciate you taking time to fill this out on paper or through your MyChart. I value your feedback.  Venetia Night, MSN, FNP-BC, AGACNP-BC [facility]

## 2022-09-09 NOTE — Progress Notes (Signed)
GI Office Note    Referring Provider: [facility] Primary Care Physician:  [facility]  Primary Gastroenterologist: Dr. Gala Romney  Chief Complaint   Chief Complaint  Patient presents with   Blood In Stools    Hasn't seen since ER visit and starting pantoprazole. States that the pantoprazole has also helped with being able to swallow foods better.     History of Present Illness   Derek Carson is a 41 y.o. male presenting today at the request of [facility] for rectal bleeding.   Patient seen at [facility] on 01/04/2022 with reports of rectal bleeding.  Denied any history of internal or external hemorrhoids, Crohn's or ulcerative colitis.  Reports a somewhat remote family history of colon cancer.  Former smoker.  He had negative abdominal CT scan.  Hemoglobin was 12.2 with normocytic indices, positive FOBT.  Normal electrolytes, kidney function, liver function.  BUN mildly elevated at 22.  He was given a liter of IV fluids, dose of pantoprazole and discharged home.  He was advised to follow-up with GI for EGD/colonoscopy to check for source of bleeding, to reduce aspirin from 325 mg daily to 81 mg daily and to take Pepcid or over-the-counter PPI to help with stomach acid as well as daily multivitamin with iron.  Most recent hemoglobin 12.2 on 09/04/2022.  Hemoglobin prior to this within normal limits from 14-16.   Today:  Repots he had some flaming hot doritos and on Friday he noticed a lot of bright red blood in the commode. A large episode of blood only, no clots. He reports he was given a dose of pantoprazole then the gurgling stopped and this feeling settled out. After his protonix he saw some clots with bright red blood. No further episodes of bleeding at home. Has felt better. Used to have bad acid reflux (omeprazole)  previously had issues with nexium. After his gallbladder surgery he had stomach issues but if he eats like he should he does  not have an issue. Drinks occasional and some occasional vaping. No cigarettes, quit in 2014/2015. Loves spicy foods. Eats fried foods occasionally. For 2 days after he did notice some dark stools but none since then. No pica.   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 He states he had a few weeks of trouble swallowing as well and had an ED visit for that and reported trouble breathing as well. He reports that after receiving the protonix he has not had anymore trouble with swallowing. He states he was sent home with prescription for protonix 20 mg twice daily.   Has a little asthma but no recent trouble with shortness of breath, chest pain, edema, lower extremity edema.   Wife has stage 4 cancer, on hormone therapy.   Reports he had an endoscopy in 2006 or prior to that, done at [facility].    Current Outpatient Medications  Medication Sig Dispense Refill   levothyroxine (SYNTHROID) 125 MCG tablet Take 125 mcg by mouth daily.     pantoprazole (PROTONIX) 20 MG tablet Take 20 mg by mouth 2 (two) times daily.     sertraline (ZOLOFT) 100 MG tablet Take 100 mg by mouth daily.     No current facility-administered medications for this visit.    Past Medical History:  Diagnosis Date   Asthma    Thyroid disease    hypothyroid    Past Surgical  History:  Procedure Laterality Date   CHOLECYSTECTOMY     KNEE SURGERY Right     No family history on file.  Allergies as of 09/09/2022 - Review Complete 09/09/2022  Allergen Reaction Noted   Codeine  02/07/2019   Phenergan [promethazine hcl]  02/07/2019    Social History   Socioeconomic History   Marital status: Married    Spouse name: Not on file   Number of children: Not on file   Years of education: Not on file   Highest education level: Not on file  Occupational History   Not on file  Tobacco Use   Smoking status: Former    Types: Cigars   Smokeless  tobacco: Not on file  Vaping Use   Vaping Use: Some days  Substance and Sexual Activity   Alcohol use: Yes    Comment: occ   Drug use: Never   Sexual activity: Yes  Other Topics Concern   Not on file  Social History Narrative   Not on file   Social Determinants of Health   Financial Resource Strain: Not on file  Food Insecurity: Not on file  Transportation Needs: Not on file  Physical Activity: Not on file  Stress: Not on file  Social Connections: Not on file  Intimate Partner Violence: Not on file     Review of Systems   Gen: Denies any fever, chills, fatigue, weight loss, lack of appetite.  CV: Denies chest pain, heart palpitations, peripheral edema, syncope.  Resp: Denies shortness of breath at rest or with exertion. Denies wheezing or cough.  GI: see HPI GU : Denies urinary burning, urinary frequency, urinary hesitancy MS: Denies joint pain, muscle weakness, cramps, or limitation of movement.  Derm: Denies rash, itching, dry skin Psych: Denies depression, anxiety, memory loss, and confusion Heme: Denies bruising, bleeding, and enlarged lymph nodes.   Physical Exam   BP (!) 151/92 (BP Location: Right Arm, Patient Position: Sitting, Cuff Size: Large)   Pulse 99   Temp 97.8 F (36.6 C) (Oral)   Ht 5\' 11"  (1.803 m)   Wt (!) 300 lb 3.2 oz (136.2 kg)   SpO2 98%   BMI 41.87 kg/m   General:   Alert and oriented. Pleasant and cooperative. Well-nourished and well-developed.  Head:  Normocephalic and atraumatic. Eyes:  Without icterus, sclera clear and conjunctiva pink.  Ears:  Normal auditory acuity. Mouth:  No deformity or lesions, oral mucosa pink.  Lungs:  Clear to auscultation bilaterally. No wheezes, rales, or rhonchi. No distress.  Heart:  S1, S2 present without murmurs appreciated.  Abdomen:  +BS, soft, non-tender and non-distended. No HSM noted. No guarding or rebound. No masses appreciated.  Rectal:  Deferred  Msk:  Symmetrical without gross deformities.  Normal posture. Extremities:  Without edema. Neurologic:  Alert and  oriented x4;  grossly normal neurologically. Skin:  Intact without significant lesions or rashes. Psych:  Alert and cooperative. Normal mood and affect.   Assessment   Derek Carson is a 41 y.o. male with a history of asthma and hypothyroidism presenting today with recent episode of rectal bleeding.   Rectal bleeding/normocytic anemia: Recent visit to [facility] on 08/08/2022 with report of an episode of rectal bleeding.  His hemoglobin was found to be 12.2 with normocytic indices.  Iron panel not checked.  He was given IV fluids and a dose of pantoprazole IV.  CT scan negative.  Advised to reduce his aspirin from 325 mg to 81 mg daily or consider  stopping it altogether.  He was discharged on pantoprazole 20 mg twice daily.  He reports eating plenty hydrated as of Friday and noticed a lot of bright red blood in the commode with a large episode of blood only therefore he presented to the ED.  He reports only a couple episodes of BRBPR, and at one point noticed some small clots and then had some dark stools 2 days after his ED visit.  He has a history of acid reflux.  Denies any abdominal pain, lack of appetite, early satiety.  He does report aspirin 325 mg daily as well as some recent daily ibuprofen use due to discomfort in his throat related to a thyroid nodule.  He had reported some difficulty swallowing related to this, but however he states that since starting the pantoprazole he has not had any more trouble swallowing.  He reports occasional alcohol use on a social basis, quit cigarette smoking in 2014/2015, and does admit to social vaping.  Also does eat a lot of spicy foods, and occasionally fried foods.  Denies any pica.  He denies any chest pain, lower extremity edema, or increased shortness of breath.  Reports prior endoscopy around 2006 or sometime prior to that completed at [facility].  We will request these records,  however given mild anemia and recent episode of rectal bleeding, we will proceed with upper endoscopy and colonoscopy for further evaluation.  Advised to avoid all NSAIDs and aspirin products.  We will recheck CBC and obtain an iron panel.  GERD/dysphagia: History of acid reflux.  Was previously on omeprazole.  Had tried Nexium in the past, noting intolerance.  Does not take any spicy foods as well as fried foods.  Denies any nausea, vomiting, or abdominal pain.  Advised to continue pantoprazole 20 mg twice daily.  We will further evaluate with upper endoscopy.  He should continue to follow with his PCP regarding thyroid nodule.   PLAN    CBC, Iron panel Avoid NSAIDs Proceed with upper endoscopy and colonoscopy with propofol by Dr. Jena Gauss in near future: the risks, benefits, and alternatives have been discussed with the patient in detail. The patient states understanding and desires to proceed. ASA 3 due to BMI Continue pantoprazole 20 mg twice daily, refilled today. Request records for prior EGD at [facility].  Follow-up to be determined   Brooke Bonito, MSN, FNP-BC, AGACNP-BC [facility]

## 2022-09-10 ENCOUNTER — Telehealth: Payer: Self-pay | Admitting: *Deleted

## 2022-09-10 NOTE — Telephone Encounter (Signed)
Contacted [employer] for PA for procedures: per Greggory Keen, no PA is needed for the procedures.

## 2022-10-04 ENCOUNTER — Telehealth: Payer: Self-pay | Admitting: Gastroenterology

## 2022-10-04 NOTE — Telephone Encounter (Signed)
Received patient's labs from [facility] performed 09/17/2022:  Hemoglobin 11.9, MCV 88, platelets 376  Iron 31, iron saturation 11   Please notify patient that I like for him to begin taking an over-the-counter oral iron supplement daily.  Given that his procedures in 2 weeks, he can start after his procedures as he would need to hold this for 10 days prior to his procedures anyway.  Venetia Night, MSN, APRN, FNP-BC, AGACNP-BC [facility]

## 2022-10-04 NOTE — Telephone Encounter (Signed)
Spoke to pt, informed him of results and recommendations. Pt voiced understanding.  

## 2022-10-14 ENCOUNTER — Encounter (HOSPITAL_COMMUNITY): Payer: Self-pay

## 2022-10-14 ENCOUNTER — Other Ambulatory Visit: Payer: Self-pay

## 2022-10-14 ENCOUNTER — Encounter (HOSPITAL_COMMUNITY)
Admission: RE | Admit: 2022-10-14 | Discharge: 2022-10-14 | Disposition: A | Discharge status: Home / Self Care | Payer: No Typology Code available for payment source | Source: Ambulatory Visit | Attending: Internal Medicine | Admitting: Internal Medicine

## 2022-10-15 ENCOUNTER — Encounter: Payer: Self-pay | Admitting: *Deleted

## 2022-10-15 ENCOUNTER — Telehealth: Payer: Self-pay | Admitting: Internal Medicine

## 2022-10-15 MED ORDER — PEG 3350-KCL-NA BICARB-NACL 420 G PO SOLR: 4000.0000 mL | Freq: Once | ORAL | 0 refills | Status: AC

## 2022-10-15 NOTE — Telephone Encounter (Signed)
Pt informed that prescription would be sent to the pharmacy and e-mailed instructions to pt.

## 2022-10-15 NOTE — Telephone Encounter (Signed)
Patient left a message that the pharmacy doesn't have his prep for colonoscopy.

## 2022-10-18 ENCOUNTER — Ambulatory Visit (HOSPITAL_COMMUNITY): Payer: No Typology Code available for payment source | Admitting: Anesthesiology

## 2022-10-18 ENCOUNTER — Ambulatory Visit (HOSPITAL_BASED_OUTPATIENT_CLINIC_OR_DEPARTMENT_OTHER): Payer: No Typology Code available for payment source | Admitting: Anesthesiology

## 2022-10-18 ENCOUNTER — Encounter (HOSPITAL_COMMUNITY)
Admission: RE | Disposition: A | Discharge status: Home / Self Care | Payer: Self-pay | Source: Home / Self Care | Attending: Internal Medicine

## 2022-10-18 ENCOUNTER — Other Ambulatory Visit: Payer: Self-pay

## 2022-10-18 ENCOUNTER — Encounter (HOSPITAL_COMMUNITY): Payer: Self-pay

## 2022-10-18 ENCOUNTER — Ambulatory Visit (HOSPITAL_COMMUNITY)
Admission: RE | Admit: 2022-10-18 | Discharge: 2022-10-18 | Disposition: A | Discharge status: Home / Self Care | Payer: No Typology Code available for payment source | Attending: Internal Medicine | Admitting: Internal Medicine

## 2022-10-18 DIAGNOSIS — K648 Other hemorrhoids: Secondary | ICD-10-CM | POA: Insufficient documentation

## 2022-10-18 DIAGNOSIS — Z87891 Personal history of nicotine dependence: Secondary | ICD-10-CM | POA: Diagnosis not present

## 2022-10-18 DIAGNOSIS — K2970 Gastritis, unspecified, without bleeding: Secondary | ICD-10-CM | POA: Diagnosis not present

## 2022-10-18 DIAGNOSIS — K3189 Other diseases of stomach and duodenum: Secondary | ICD-10-CM | POA: Insufficient documentation

## 2022-10-18 DIAGNOSIS — Z6841 Body Mass Index (BMI) 40.0 and over, adult: Secondary | ICD-10-CM | POA: Insufficient documentation

## 2022-10-18 DIAGNOSIS — D509 Iron deficiency anemia, unspecified: Secondary | ICD-10-CM

## 2022-10-18 DIAGNOSIS — K625 Hemorrhage of anus and rectum: Secondary | ICD-10-CM | POA: Diagnosis not present

## 2022-10-18 DIAGNOSIS — R12 Heartburn: Secondary | ICD-10-CM

## 2022-10-18 DIAGNOSIS — R1310 Dysphagia, unspecified: Secondary | ICD-10-CM | POA: Diagnosis not present

## 2022-10-18 DIAGNOSIS — E039 Hypothyroidism, unspecified: Secondary | ICD-10-CM | POA: Insufficient documentation

## 2022-10-18 DIAGNOSIS — F419 Anxiety disorder, unspecified: Secondary | ICD-10-CM

## 2022-10-18 DIAGNOSIS — Z79890 Hormone replacement therapy (postmenopausal): Secondary | ICD-10-CM | POA: Diagnosis not present

## 2022-10-18 DIAGNOSIS — J45909 Unspecified asthma, uncomplicated: Secondary | ICD-10-CM | POA: Diagnosis not present

## 2022-10-18 HISTORY — PX: COLONOSCOPY WITH PROPOFOL: SHX5780

## 2022-10-18 HISTORY — PX: ESOPHAGOGASTRODUODENOSCOPY (EGD) WITH PROPOFOL: SHX5813

## 2022-10-18 HISTORY — PX: BIOPSY: SHX5522

## 2022-10-18 SURGERY — COLONOSCOPY WITH PROPOFOL
Anesthesia: General

## 2022-10-18 MED ORDER — LACTATED RINGERS IV SOLN: INTRAVENOUS | Status: DC

## 2022-10-18 MED ORDER — LACTATED RINGERS IV SOLN: INTRAVENOUS | Status: DC | PRN

## 2022-10-18 MED ORDER — PROPOFOL 10 MG/ML IV BOLUS
INTRAVENOUS | Status: DC | PRN
  Administered 2022-10-18: 150 mg via INTRAVENOUS

## 2022-10-18 MED ORDER — PROPOFOL 500 MG/50ML IV EMUL
INTRAVENOUS | Status: DC | PRN
  Administered 2022-10-18: 150 ug/kg/min via INTRAVENOUS

## 2022-10-18 MED ORDER — LIDOCAINE HCL (CARDIAC) PF 100 MG/5ML IV SOSY
PREFILLED_SYRINGE | INTRAVENOUS | Status: DC | PRN
  Administered 2022-10-18: 60 mg via INTRATRACHEAL

## 2022-10-18 MED ORDER — STERILE WATER FOR IRRIGATION IR SOLN
Status: DC | PRN
  Administered 2022-10-18: 60 mL

## 2022-10-18 NOTE — Op Note (Signed)
[facility] Patient Name: Derek Carson Procedure Date: 10/18/2022 9:07 AM MRN: [id number] Date of Birth: [birth year] Attending MD: Elon Alas. Abbey Chatters , [place], [id number] CSN: [id number] Age: 41 Admit Type: Outpatient Procedure:                Colonoscopy Indications:              Rectal bleeding, Iron deficiency anemia Providers:                Elon Alas. Abbey Chatters, DO, Caprice Kluver, Raphael Gibney,                            Technician Referring MD:             Elon Alas. Abbey Chatters, DO Medicines:                See the Anesthesia note for documentation of the                            administered medications Complications:            No immediate complications. Estimated Blood Loss:     Estimated blood loss: none. Procedure:                Pre-Anesthesia Assessment:                           - The anesthesia plan was to use monitored                            anesthesia care (MAC).                           After obtaining informed consent, the colonoscope                            was passed under direct vision. Throughout the                            procedure, the patient's blood pressure, pulse, and                            oxygen saturations were monitored continuously. The                            PCF-HQ190L ([id number]) was introduced through the                            anus and advanced to the the terminal ileum, with                            identification of the appendiceal orifice and IC                            valve. The colonoscopy was performed without                            difficulty. The patient tolerated  the procedure                            well. The quality of the bowel preparation was                            evaluated using the BBPS [facility] Bowel Preparation                            Scale) with scores of: Right Colon = 3, Transverse                            Colon = 3 and Left Colon = 3 (entire mucosa seen                            well  with no residual staining, small fragments of                            stool or opaque liquid). The total BBPS score                            equals 9. Scope In: 9:23:36 AM Scope Out: 9:33:01 AM Scope Withdrawal Time: 0 hours 7 minutes 50 seconds  Total Procedure Duration: 0 hours 9 minutes 25 seconds  Findings:      The perianal and digital rectal examinations were normal.      Non-bleeding internal hemorrhoids were found during retroflexion.      The terminal ileum appeared normal.      The exam was otherwise without abnormality on direct and retroflexion       views. Impression:               - Non-bleeding internal hemorrhoids.                           - The examined portion of the ileum was normal.                           - The examination was otherwise normal on direct                            and retroflexion views.                           - No specimens collected. Moderate Sedation:      Per Anesthesia Care Recommendation:           - Patient has a contact number available for                            emergencies. The signs and symptoms of potential                            delayed complications were discussed with the  patient. Return to normal activities tomorrow.                            Written discharge instructions were provided to the                            patient.                           - Resume previous diet.                           - Continue present medications.                           - Repeat colonoscopy in 10 years for screening                            purposes.                           - Return to GI clinic in 3 months.                           - Consider hemorrhoid banding if bleeding continues Procedure Code(s):        --- Professional ---                           [phone], Colonoscopy, flexible; diagnostic, including                            collection of specimen(s) by brushing or washing,                             when performed (separate procedure) Diagnosis Code(s):        --- Professional ---                           K64.8, Other hemorrhoids                           K62.5, Hemorrhage of anus and rectum                           D50.9, Iron deficiency anemia, unspecified CPT copyright 2022 American Medical Association. All rights reserved. The codes documented in this report are preliminary and upon coder review may  be revised to meet current compliance requirements. Elon Alas. Abbey Chatters, DO [name], DO 10/18/2022 9:35:27 AM This report has been signed electronically. Number of Addenda: 0

## 2022-10-18 NOTE — Anesthesia Preprocedure Evaluation (Signed)
Anesthesia Evaluation  Patient identified by MRN, date of birth, ID band Patient awake    Reviewed: Allergy & Precautions, H&P , NPO status , Patient's Chart, lab work & pertinent test results, reviewed documented beta blocker date and time   Airway Mallampati: II  TM Distance: >3 FB Neck ROM: full    Dental no notable dental hx.    Pulmonary asthma , Patient abstained from smoking., former smoker   Pulmonary exam normal breath sounds clear to auscultation       Cardiovascular Exercise Tolerance: Good negative cardio ROS  Rhythm:regular Rate:Normal     Neuro/Psych   Anxiety     negative neurological ROS  negative psych ROS   GI/Hepatic negative GI ROS, Neg liver ROS,,,  Endo/Other    Morbid obesity  Renal/GU negative Renal ROS  negative genitourinary   Musculoskeletal   Abdominal   Peds  Hematology negative hematology ROS (+)   Anesthesia Other Findings   Reproductive/Obstetrics negative OB ROS                             Anesthesia Physical Anesthesia Plan  ASA: 3  Anesthesia Plan: General   Post-op Pain Management:    Induction:   PONV Risk Score and Plan: Propofol infusion  Airway Management Planned:   Additional Equipment:   Intra-op Plan:   Post-operative Plan:   Informed Consent: I have reviewed the patients History and Physical, chart, labs and discussed the procedure including the risks, benefits and alternatives for the proposed anesthesia with the patient or authorized representative who has indicated his/her understanding and acceptance.     Dental Advisory Given  Plan Discussed with: CRNA  Anesthesia Plan Comments:        Anesthesia Quick Evaluation

## 2022-10-18 NOTE — Anesthesia Postprocedure Evaluation (Signed)
Anesthesia Post Note  Patient: Derek Carson  Procedure(s) Performed: COLONOSCOPY WITH PROPOFOL ESOPHAGOGASTRODUODENOSCOPY (EGD) WITH PROPOFOL BIOPSY  Patient location during evaluation: Phase II Anesthesia Type: General Level of consciousness: awake Pain management: pain level controlled Vital Signs Assessment: post-procedure vital signs reviewed and stable Respiratory status: spontaneous breathing and respiratory function stable Cardiovascular status: blood pressure returned to baseline and stable Postop Assessment: no headache and no apparent nausea or vomiting Anesthetic complications: no Comments: Late entry   No notable events documented.   Last Vitals:  Vitals:   10/18/22 0825 10/18/22 0936  BP: 134/86 (!) 102/53  Pulse: 66 65  Resp: 16 18  Temp: 36.7 C 36.8 C  SpO2: 97% 95%    Last Pain:  Vitals:   10/18/22 0936  TempSrc: Axillary  PainSc: 0-No pain                 Windell Norfolk

## 2022-10-18 NOTE — Discharge Instructions (Addendum)
EGD Discharge instructions Please read the instructions outlined below and refer to this sheet in the next few weeks. These discharge instructions provide you with general information on caring for yourself after you leave the hospital. Your doctor may also give you specific instructions. While your treatment has been planned according to the most current medical practices available, unavoidable complications occasionally occur. If you have any problems or questions after discharge, please call your doctor. ACTIVITY You may resume your regular activity but move at a slower pace for the next 24 hours.  Take frequent rest periods for the next 24 hours.  Walking will help expel (get rid of) the air and reduce the bloated feeling in your abdomen.  No driving for 24 hours (because of the anesthesia (medicine) used during the test).  You may shower.  Do not sign any important legal documents or operate any machinery for 24 hours (because of the anesthesia used during the test).  NUTRITION Drink plenty of fluids.  You may resume your normal diet.  Begin with a light meal and progress to your normal diet.  Avoid alcoholic beverages for 24 hours or as instructed by your caregiver.  MEDICATIONS You may resume your normal medications unless your caregiver tells you otherwise.  WHAT YOU CAN EXPECT TODAY You may experience abdominal discomfort such as a feeling of fullness or "gas" pains.  FOLLOW-UP Your doctor will discuss the results of your test with you.  SEEK IMMEDIATE MEDICAL ATTENTION IF ANY OF THE FOLLOWING OCCUR: Excessive nausea (feeling sick to your stomach) and/or vomiting.  Severe abdominal pain and distention (swelling).  Trouble swallowing.  Temperature over 101 F (37.8 C).  Rectal bleeding or vomiting of blood.    Colonoscopy Discharge Instructions  Read the instructions outlined below and refer to this sheet in the next few weeks. These discharge instructions provide you with  general information on caring for yourself after you leave the hospital. Your doctor may also give you specific instructions. While your treatment has been planned according to the most current medical practices available, unavoidable complications occasionally occur.   ACTIVITY You may resume your regular activity, but move at a slower pace for the next 24 hours.  Take frequent rest periods for the next 24 hours.  Walking will help get rid of the air and reduce the bloated feeling in your belly (abdomen).  No driving for 24 hours (because of the medicine (anesthesia) used during the test).   Do not sign any important legal documents or operate any machinery for 24 hours (because of the anesthesia used during the test).  NUTRITION Drink plenty of fluids.  You may resume your normal diet as instructed by your doctor.  Begin with a light meal and progress to your normal diet. Heavy or fried foods are harder to digest and may make you feel sick to your stomach (nauseated).  Avoid alcoholic beverages for 24 hours or as instructed.  MEDICATIONS You may resume your normal medications unless your doctor tells you otherwise.  WHAT YOU CAN EXPECT TODAY Some feelings of bloating in the abdomen.  Passage of more gas than usual.  Spotting of blood in your stool or on the toilet paper.  IF YOU HAD POLYPS REMOVED DURING THE COLONOSCOPY: No aspirin products for 7 days or as instructed.  No alcohol for 7 days or as instructed.  Eat a soft diet for the next 24 hours.  FINDING OUT THE RESULTS OF YOUR TEST Not all test results are available  during your visit. If your test results are not back during the visit, make an appointment with your caregiver to find out the results. Do not assume everything is normal if you have not heard from your caregiver or the medical facility. It is important for you to follow up on all of your test results.  SEEK IMMEDIATE MEDICAL ATTENTION IF: You have more than a spotting of  blood in your stool.  Your belly is swollen (abdominal distention).  You are nauseated or vomiting.  You have a temperature over 101.  You have abdominal pain or discomfort that is severe or gets worse throughout the day.   Your EGD revealed mild amount inflammation in your stomach.  I took biopsies of this to rule out infection with a bacteria called H. pylori.  Await pathology results, my office will contact you.  Esophagus appeared normal.  I did not need to stretch it today.  Small bowel normal.  Continue on pantoprazole.  Your colonoscopy was relatively unremarkable.  I did not find any polyps or evidence of colon cancer.  I recommend repeating colonoscopy in 10 years for colon cancer screening purposes.    You do have internal hemorrhoids which is likely the cause of your bleeding.  If bleeding continues, we can consider banding in the clinic.  Follow-up with GI in 3 months.  I hope you have a great rest of your week!  Hennie Duos. Marletta Lor, D.O. Gastroenterology and Hepatology [facility]

## 2022-10-18 NOTE — Op Note (Signed)
[facility] Patient Name: Derek Carson Procedure Date: 10/18/2022 9:08 AM MRN: [id number] Date of Birth: [birth year] Attending MD: Elon Alas. Abbey Chatters , [place], [id number] CSN: [id number] Age: 41 Admit Type: Outpatient Procedure:                Upper GI endoscopy Indications:              Dysphagia, Heartburn Providers:                Elon Alas. Abbey Chatters, DO, Caprice Kluver, Raphael Gibney,                            Technician Referring MD:             Elon Alas. Abbey Chatters, DO Medicines:                See the Anesthesia note for documentation of the                            administered medications Complications:            No immediate complications. Estimated Blood Loss:     Estimated blood loss was minimal. Procedure:                Pre-Anesthesia Assessment:                           - The anesthesia plan was to use monitored                            anesthesia care (MAC).                           After obtaining informed consent, the endoscope was                            passed under direct vision. Throughout the                            procedure, the patient's blood pressure, pulse, and                            oxygen saturations were monitored continuously. The                            [id number]-H190 ([id number]) scope was introduced through the                            mouth, and advanced to the second part of duodenum.                            The upper GI endoscopy was accomplished without                            difficulty. The patient tolerated the procedure                            well. Scope In:  9:16:42 AM Scope Out: 9:19:46 AM Total Procedure Duration: 0 hours 3 minutes 4 seconds  Findings:      The Z-line was regular and was found 39 cm from the incisors.      There is no endoscopic evidence of stenosis or stricture in the entire       esophagus.      Patchy mild inflammation characterized by erythema was found in the       gastric body and in the gastric  antrum. Biopsies were taken with a cold       forceps for Helicobacter pylori testing.      The duodenal bulb, first portion of the duodenum and second portion of       the duodenum were normal. Impression:               - Z-line regular, 39 cm from the incisors.                           - Gastritis. Biopsied.                           - Normal duodenal bulb, first portion of the                            duodenum and second portion of the duodenum. Moderate Sedation:      Per Anesthesia Care Recommendation:           - Patient has a contact number available for                            emergencies. The signs and symptoms of potential                            delayed complications were discussed with the                            patient. Return to normal activities tomorrow.                            Written discharge instructions were provided to the                            patient.                           - Resume previous diet.                           - Continue present medications.                           - Await pathology results.                           - Use Protonix (pantoprazole) 20 mg PO BID.                           - Return to GI clinic in 3 months.  Procedure Code(s):        --- Professional ---                           [phone], Esophagogastroduodenoscopy, flexible,                            transoral; with biopsy, single or multiple Diagnosis Code(s):        --- Professional ---                           K29.70, Gastritis, unspecified, without bleeding                           R13.10, Dysphagia, unspecified                           R12, Heartburn CPT copyright 2022 American Medical Association. All rights reserved. The codes documented in this report are preliminary and upon coder review may  be revised to meet current compliance requirements. Elon Alas. Abbey Chatters, DO [place] Abbey Chatters, DO 10/18/2022 9:21:21 AM This report has been signed  electronically. Number of Addenda: 0

## 2022-10-18 NOTE — Progress Notes (Signed)
Derek Carson was a patient at [facility] on 10/18/22. He may return to work on 10/19/22.

## 2022-10-18 NOTE — H&P (Signed)
Primary Care Physician:  [facility] Primary Gastroenterologist:  Dr. Marletta Lor  Pre-Procedure History & Physical: HPI:  Derek Carson is a 41 y.o. male is here for an EGD with possible dilation due to chronic GERD/dysphagia and colonoscopy for rectal bleeding and normocytic anemia.  Past Medical History:  Diagnosis Date   Anxiety    Asthma    Thyroid disease    hypothyroid    Past Surgical History:  Procedure Laterality Date   CHOLECYSTECTOMY     KNEE SURGERY Right     Prior to Admission medications   Medication Sig Start Date End Date Taking? Authorizing Provider  albuterol (VENTOLIN HFA) 108 (90 Base) MCG/ACT inhaler Inhale 1-2 puffs into the lungs every 6 (six) hours as needed for wheezing or shortness of breath.   Yes [provider]  hydrOXYzine (ATARAX) 25 MG tablet Take 25 mg by mouth every 8 (eight) hours as needed for anxiety. 08/23/22  Yes [provider]  levothyroxine (SYNTHROID) 125 MCG tablet Take 125 mcg by mouth daily. 01/19/19  Yes [provider]  LORazepam (ATIVAN) 0.5 MG tablet Take 0.5 mg by mouth 2 (two) times daily as needed for anxiety. 08/05/22  Yes [provider]  montelukast (SINGULAIR) 10 MG tablet Take 10 mg by mouth daily. 09/20/22  Yes [provider]  pantoprazole (PROTONIX) 20 MG tablet Take 1 tablet (20 mg total) by mouth 2 (two) times daily. 09/09/22  Yes Mahon, Frederik Schmidt, NP  sertraline (ZOLOFT) 100 MG tablet Take 100 mg by mouth daily. 09/02/22  Yes [provider]    Allergies as of 09/09/2022 - Review Complete 09/09/2022  Allergen Reaction Noted   Codeine  02/07/2019   Phenergan [promethazine hcl]  02/07/2019    Family History  Problem Relation Age of Onset   Diabetes Mother    Hypertension Father    Heart attack Father    Heart attack Paternal Grandfather    Colon cancer Neg Hx    Colon polyps Neg Hx     Social History   Socioeconomic History   Marital status:  Married    Spouse name: Not on file   Number of children: Not on file   Years of education: Not on file   Highest education level: Not on file  Occupational History   Not on file  Tobacco Use   Smoking status: Former    Types: Cigars   Smokeless tobacco: Not on file  Vaping Use   Vaping Use: Some days  Substance and Sexual Activity   Alcohol use: Yes    Comment: occ   Drug use: Never   Sexual activity: Yes  Other Topics Concern   Not on file  Social History Narrative   Not on file   Social Determinants of Health   Financial Resource Strain: Not on file  Food Insecurity: Not on file  Transportation Needs: Not on file  Physical Activity: Not on file  Stress: Not on file  Social Connections: Not on file  Intimate Partner Violence: Not on file    Review of Systems: General: Negative for fever, chills, fatigue, weakness. Eyes: Negative for vision changes.  ENT: Negative for hoarseness, difficulty swallowing , nasal congestion. CV: Negative for chest pain, angina, palpitations, dyspnea on exertion, peripheral edema.  Respiratory: Negative for dyspnea at rest, dyspnea on exertion, cough, sputum, wheezing.  GI: See history of present illness. GU:  Negative for dysuria, hematuria, urinary incontinence, urinary frequency, nocturnal urination.  MS: Negative for joint  pain, low back pain.  Derm: Negative for rash or itching.  Neuro: Negative for weakness, abnormal sensation, seizure, frequent headaches, memory loss, confusion.  Psych: Negative for anxiety, depression Endo: Negative for unusual weight change.  Heme: Negative for bruising or bleeding. Allergy: Negative for rash or hives.  Physical Exam: Vital signs in last 24 hours: Temp:  [98.1 F (36.7 C)] 98.1 F (36.7 C) (11/13 0825) Pulse Rate:  [66] 66 (11/13 0825) Resp:  [16] 16 (11/13 0825) BP: (134)/(86) 134/86 (11/13 0825) SpO2:  [97 %] 97 % (11/13 0825) Weight:  [133.8 kg] 133.8 kg (11/13 0825)   General:    Alert,  Well-developed, well-nourished, pleasant and cooperative in NAD Head:  Normocephalic and atraumatic. Eyes:  Sclera clear, no icterus.   Conjunctiva pink. Ears:  Normal auditory acuity. Nose:  No deformity, discharge,  or lesions. Mouth:  No deformity or lesions, dentition normal. Neck:  Supple; no masses or thyromegaly. Lungs:  Clear throughout to auscultation.   No wheezes, crackles, or rhonchi. No acute distress. Heart:  Regular rate and rhythm; no murmurs, clicks, rubs,  or gallops. Abdomen:  Soft, nontender and nondistended. No masses, hepatosplenomegaly or hernias noted. Normal bowel sounds, without guarding, and without rebound.   Msk:  Symmetrical without gross deformities. Normal posture. Extremities:  Without clubbing or edema. Neurologic:  Alert and  oriented x4;  grossly normal neurologically. Skin:  Intact without significant lesions or rashes. Cervical Nodes:  No significant cervical adenopathy. Psych:  Alert and cooperative. Normal mood and affect.   Impression/Plan: Derek Carson is here for an EGD with possible dilation due to chronic GERD/dysphagia and colonoscopy for rectal bleeding and normocytic anemia.  Risks, benefits, limitations, imponderables and alternatives regarding EGD have been reviewed with the patient. Questions have been answered. All parties agreeable.

## 2022-10-18 NOTE — Transfer of Care (Signed)
Immediate Anesthesia Transfer of Care Note  Patient: Derek Carson  Procedure(s) Performed: COLONOSCOPY WITH PROPOFOL ESOPHAGOGASTRODUODENOSCOPY (EGD) WITH PROPOFOL BIOPSY  Patient Location: Short Stay  Anesthesia Type:General  Level of Consciousness: sedated  Airway & Oxygen Therapy: Patient Spontanous Breathing  Post-op Assessment: Report given to RN and Post -op Vital signs reviewed and stable  Post vital signs: Reviewed and stable  Last Vitals:  Vitals Value Taken Time  BP 110/80   Temp 98   Pulse 65   Resp 16   SpO2 96     Last Pain:  Vitals:   10/18/22 0912  TempSrc:   PainSc: 0-No pain         Complications: No notable events documented.

## 2022-10-19 LAB — SURGICAL PATHOLOGY

## 2022-10-25 ENCOUNTER — Encounter (HOSPITAL_COMMUNITY): Payer: Self-pay | Admitting: Internal Medicine

## 2022-10-25 ENCOUNTER — Ambulatory Visit: Payer: No Typology Code available for payment source | Admitting: "Endocrinology

## 2022-10-25 VITALS — BP 138/88 | HR 88 | Ht 71.0 in | Wt 304.4 lb

## 2022-10-25 DIAGNOSIS — E038 Other specified hypothyroidism: Secondary | ICD-10-CM | POA: Insufficient documentation

## 2022-10-25 DIAGNOSIS — E063 Autoimmune thyroiditis: Secondary | ICD-10-CM

## 2022-10-25 DIAGNOSIS — E049 Nontoxic goiter, unspecified: Secondary | ICD-10-CM | POA: Insufficient documentation

## 2022-10-25 MED ORDER — LEVOTHYROXINE SODIUM 150 MCG PO TABS: 150.0000 ug | ORAL_TABLET | Freq: Every day | ORAL | 1 refills | Status: DC

## 2022-10-25 NOTE — Progress Notes (Signed)
Endocrinology Consult Note                                            10/25/2022, 6:14 PM   Subjective:    Patient ID: Derek Carson, male    DOB: [birth year], PCP Practice, [facility]   Past Medical History:  Diagnosis Date   Anxiety    Asthma    Thyroid disease    hypothyroid   Past Surgical History:  Procedure Laterality Date   BIOPSY  10/18/2022   Procedure: BIOPSY;  Surgeon: Lanelle Bal, DO;  [facility]: [facility];  Service: Endoscopy;;   CHOLECYSTECTOMY     COLONOSCOPY WITH PROPOFOL N/A 10/18/2022   Procedure: COLONOSCOPY WITH PROPOFOL;  Surgeon: Lanelle Bal, DO;  [facility]: [facility];  Service: Endoscopy;  Laterality: N/A;  10:15 am   ESOPHAGOGASTRODUODENOSCOPY (EGD) WITH PROPOFOL N/A 10/18/2022   Procedure: ESOPHAGOGASTRODUODENOSCOPY (EGD) WITH PROPOFOL;  Surgeon: Lanelle Bal, DO;  [facility]: [facility];  Service: Endoscopy;  Laterality: N/A;   KNEE SURGERY Right    Social History   Socioeconomic History   Marital status: Married    Spouse name: Not on file   Number of children: Not on file   Years of education: Not on file   Highest education level: Not on file  Occupational History   Not on file  Tobacco Use   Smoking status: Former    Types: Cigars   Smokeless tobacco: Not on file  Vaping Use   Vaping Use: Some days  Substance and Sexual Activity   Alcohol use: Yes    Comment: occ   Drug use: Never   Sexual activity: Yes  Other Topics Concern   Not on file  Social History Narrative   Not on file   Social Determinants of Health   Financial Resource Strain: Not on file  Food Insecurity: Not on file  Transportation Needs: Not on file  Physical Activity: Not on file  Stress: Not on file  Social Connections: Not on file   Family History  Problem Relation Age of Onset   Diabetes Mother    Diabetes Father    Hypertension Father    Heart attack Father    Heart attack Paternal Grandfather    Colon  cancer Neg Hx    Colon polyps Neg Hx    Outpatient Encounter Medications as of 10/25/2022  Medication Sig   albuterol (VENTOLIN HFA) 108 (90 Base) MCG/ACT inhaler Inhale 1-2 puffs into the lungs every 6 (six) hours as needed for wheezing or shortness of breath.   hydrOXYzine (ATARAX) 25 MG tablet Take 25 mg by mouth every 8 (eight) hours as needed for anxiety.   levothyroxine (SYNTHROID) 150 MCG tablet Take 1 tablet (150 mcg total) by mouth daily before breakfast.   LORazepam (ATIVAN) 0.5 MG tablet Take 0.5 mg by mouth 2 (two) times daily as needed for anxiety.   montelukast (SINGULAIR) 10 MG tablet Take 10 mg by mouth daily.   pantoprazole (PROTONIX) 20 MG tablet Take 1 tablet (20 mg total) by mouth 2 (two) times daily.   sertraline (ZOLOFT) 100 MG tablet Take 100 mg by mouth daily.   [DISCONTINUED] levothyroxine (SYNTHROID) 125 MCG tablet Take 125 mcg by mouth daily.   No facility-administered encounter medications on file as of 10/25/2022.   ALLERGIES: Allergies  Allergen Reactions   Codeine Palpitations   Phenergan [Promethazine Hcl] Rash    VACCINATION STATUS:  There is no immunization history on file for this patient.  HPI Derek Carson is 41 y.o. male who presents today with a medical history as above. he is being seen in consultation for hypothyroidism related to Hashimoto's thyroiditis, nodular goiter status post fine-needle aspiration biopsy requested by [facility].   History is obtained directly from the patient and chart review.  He was treated with levothyroxine for several years.  He is currently on levothyroxine 125 mcg p.o. daily before breakfast for hypothyroidism.  His most recent labs are consistent with under replacement.  He denies dysphagia, shortness of breath nor voice change. He denies palpitations, tremor, heat/cold intolerance. He denies family history of thyroid malignancy, however reports thyroid dysfunction. Review of  Systems  Constitutional: + Minimally fluctuating body weight, + fatigue, no subjective hyperthermia, no subjective hypothermia Eyes: no blurry vision, no xerophthalmia ENT: no sore throat, no nodules palpated in throat, no dysphagia/odynophagia, no hoarseness Cardiovascular: no Chest Pain, no Shortness of Breath, no palpitations, no leg swelling Respiratory: no cough, no shortness of breath Gastrointestinal: no Nausea/Vomiting/Diarhhea Musculoskeletal: no muscle/joint aches Skin: no rashes Neurological: no tremors, no numbness, no tingling, no dizziness Psychiatric: no depression, no anxiety  Objective:       10/25/2022    2:31 PM 10/18/2022    9:36 AM 10/18/2022    8:25 AM  Vitals with BMI  Height 5\' 11"   5\' 11"   Weight 304 lbs 6 oz  295 lbs  BMI 42.47  41.16  Systolic 138 102  Diastolic 88 53 86  Pulse 88 65 66    BP 138/88   Pulse 88   Ht 5\' 11"  (1.803 m)   Wt (!) 304 lb 6.4 oz (138.1 kg)   BMI 42.46 kg/m   Wt Readings from Last 3 Encounters:  10/25/22 (!) 304 lb 6.4 oz (138.1 kg)  10/18/22 295 lb (133.8 kg)  09/09/22 (!) 300 lb 3.2 oz (136.2 kg)    Physical Exam  Constitutional:  Body mass index is 42.46 kg/m.,  not in acute distress, normal state of mind Eyes: PERRLA, EOMI, no exophthalmos ENT: moist mucous membranes, + gross thyromegaly, no gross cervical lymphadenopathy Cardiovascular: normal precordial activity, Regular Rate and Rhythm, no Murmur/Rubs/Gallops Respiratory:  adequate breathing efforts, no gross chest deformity, Clear to auscultation bilaterally Gastrointestinal: abdomen soft, Non -tender, No distension, Bowel Sounds present, no gross organomegaly Musculoskeletal: no gross deformities, strength intact in all four extremities Skin: moist, warm, no rashes Neurological: no tremor with outstretched hands, Deep tendon reflexes normal in bilateral lower extremities.  CMP ( most recent) CMP     Component Value Date/Time   NA 138 08/10/2022  1343   K 3.7 08/10/2022 1343   CL 104 08/10/2022 1343   CO2 28 08/10/2022 1343   GLUCOSE 117 (H) 08/10/2022 1343   BUN 14 08/10/2022 1343   CREATININE 0.82 08/10/2022 1343   CALCIUM 9.1 08/10/2022 1343   PROT 7.6 08/10/2022 1343   ALBUMIN 4.2 08/10/2022 1343   AST 23 08/10/2022 1343   ALT 30 08/10/2022 1343   ALKPHOS 83 08/10/2022 1343   BILITOT 0.8 08/10/2022 1343   GFRNONAA >60 08/10/2022 1343   GFRAA >60 02/07/2019 0947         Lab Results  Component Value Date   TSH 7.147 (H) 02/08/2022   TSH 3.316 02/07/2019  Thyroid ultrasound from March 01, 2022 showed: Right lobe 6.8 cm, with 4.4 cm nodule TR 4, biopsy performed in April 2023 showing 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     Assessment & Plan:   1. Hypothyroidism due to Hashimoto's thyroiditis 2. Nodular goiter, non-toxic   - Derek Carson  is being seen at a kind request of [facility]. - I have reviewed his available thyroid records and clinically evaluated the patient. - Based on these reviews, he has hypothyroidism related to Hashimoto's thyroiditis with evidence of under replacement.  I discussed and increase his levothyroxine to 150 mcg p.o. daily before breakfast.    - We discussed about the correct intake of his thyroid hormone, on empty stomach at fasting, with water, separated by at least 30 minutes from breakfast and other medications,  and separated by more than 4 hours from calcium, iron, multivitamins, acid reflux medications (PPIs). -Patient is made aware of the fact that thyroid hormone replacement is needed for life, dose to be adjusted by periodic monitoring of thyroid function tests.  Regarding his nodular goiter, he is status post work-up including fine-needle aspiration biopsy in May 2023 with benign outcomes-category 2 lymphocytic thyroiditis. This patient will need continued surveillance.  I requested repeat ultrasound before his next  visit in 6 months.  - he is advised to maintain close follow up with [facility] for primary care needs.   - Time spent with the patient: 50 minutes, of which >50% was spent in  counseling him about his hypothyroidism, Hashimoto's thyroiditis, nodular goiter and the rest in obtaining information about his symptoms, reviewing his previous labs/studies ( including abstractions from other facilities),  evaluations, and treatments,  and developing a plan to confirm diagnosis and long term treatment based on the latest standards of care/guidelines; and documenting his care.  Quita Skye Ishaq participated in the discussions, expressed understanding, and voiced agreement with the above plans.  All questions were answered to his satisfaction. he is encouraged to contact clinic should he have any questions or concerns prior to his return visit.  Follow up plan: Return in about 6 months (around 04/25/2023) for F/U with Pre-visit Labs, Thyroid / Neck Ultrasound.   Marquis Lunch, MD [facility] [address] [place], [place] [address] Phone: [phone]  Fax: [phone]     10/25/2022, 6:14 PM  This note was partially dictated with voice recognition software. Similar sounding words can be transcribed inadequately or may not  be corrected upon review.

## 2022-11-24 ENCOUNTER — Other Ambulatory Visit: Payer: Self-pay | Admitting: Gastroenterology

## 2022-11-24 ENCOUNTER — Other Ambulatory Visit: Payer: Self-pay | Admitting: "Endocrinology

## 2022-11-24 DIAGNOSIS — K219 Gastro-esophageal reflux disease without esophagitis: Secondary | ICD-10-CM

## 2022-12-27 ENCOUNTER — Other Ambulatory Visit: Payer: Self-pay | Admitting: Gastroenterology

## 2022-12-27 DIAGNOSIS — K219 Gastro-esophageal reflux disease without esophagitis: Secondary | ICD-10-CM

## 2022-12-29 ENCOUNTER — Other Ambulatory Visit: Payer: Self-pay

## 2022-12-29 MED ORDER — LEVOTHYROXINE SODIUM 150 MCG PO TABS: ORAL_TABLET | ORAL | 2 refills | Status: DC

## 2022-12-31 IMAGING — CT CT HEAD W/O CM
4 series · 16 of 47 positions shown, 18 images · non-contrast
Comparison: 02/07/2019 brain MR. Report of 11/23/2017 head CT from
[HOSPITAL] [facility].

CLINICAL DATA: Dizziness.  Found unresponsive at work.



[Series 2: head w o · axial · 0.45mm/px · z∈[-53,+67]mm · 7 of 33 slices shown, 9 images]
[im 5/33  brain]
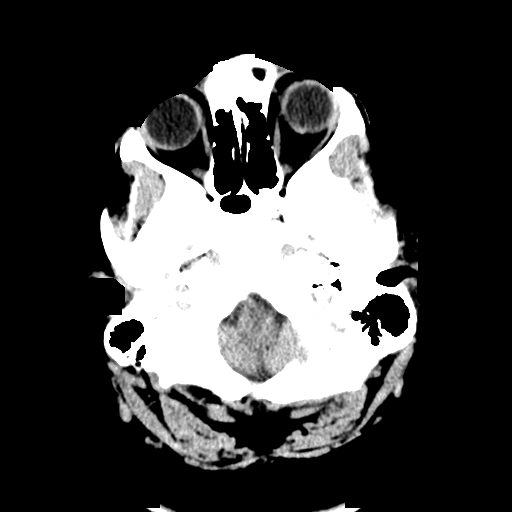
[im 5/33  bone]
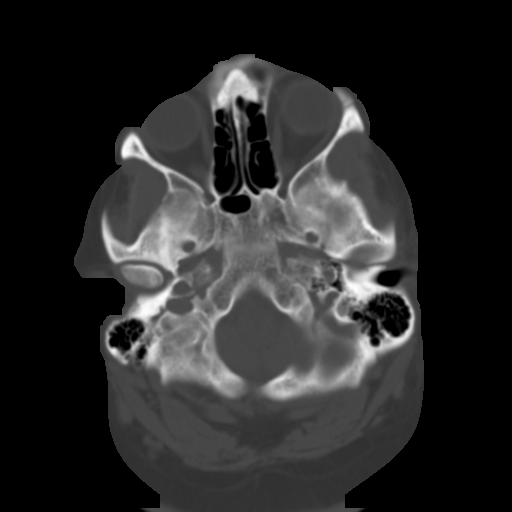
[im 9/33  brain]
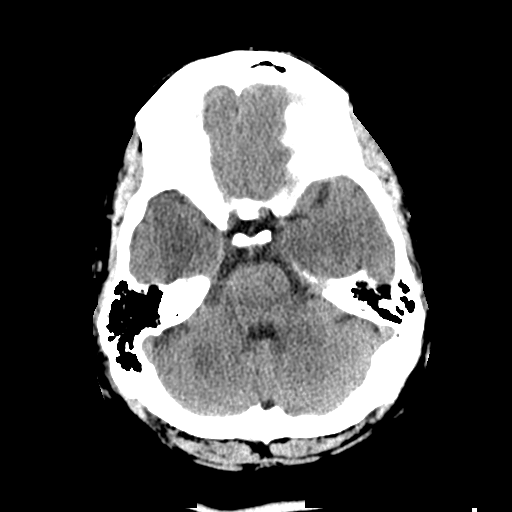
[im 13/33  brain]
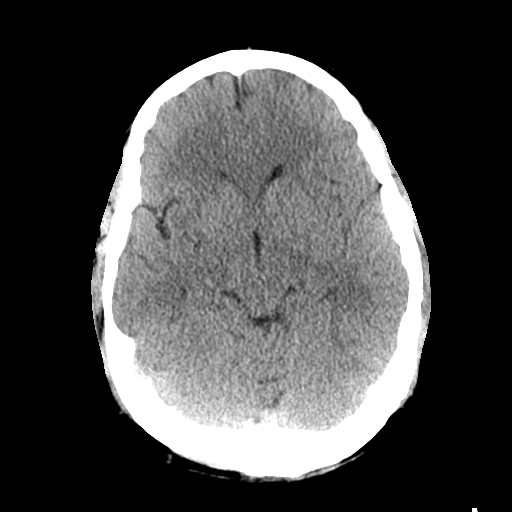
[im 17/33  brain]
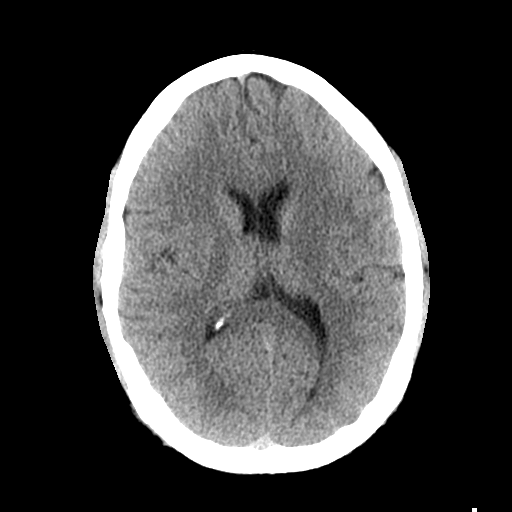
[im 21/33  brain]
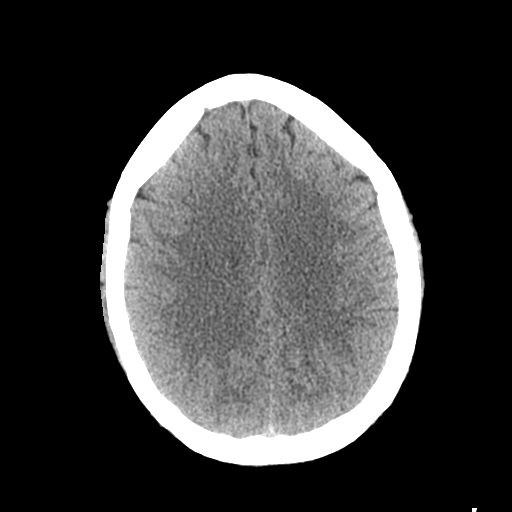
[im 21/33  bone]
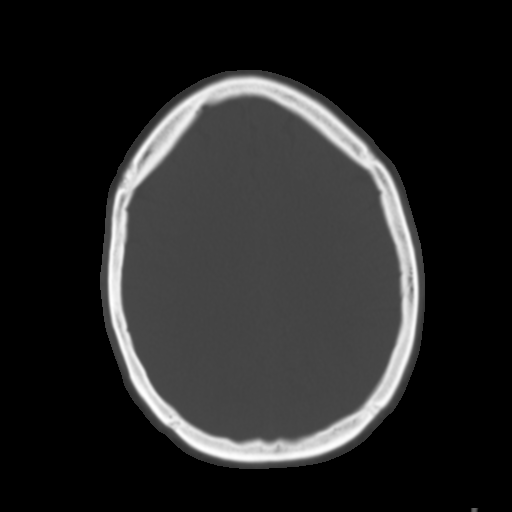
[im 25/33  brain]
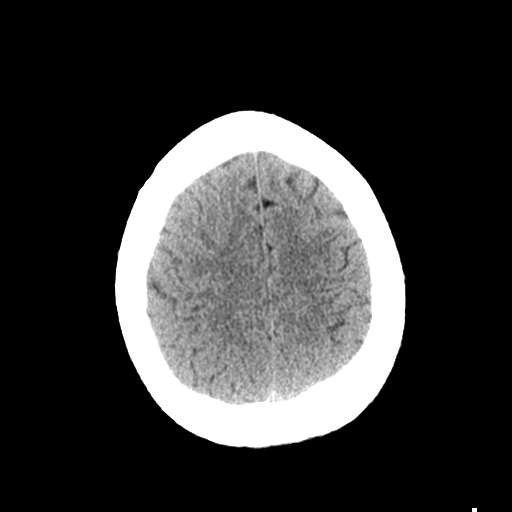
[im 29/33  brain]
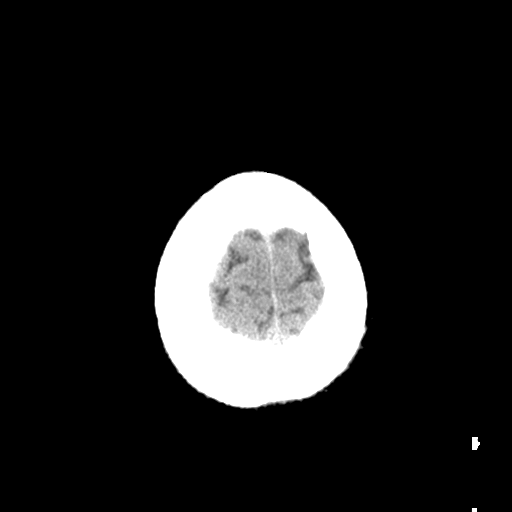

[Series 3: head bone · axial · 0.45mm/px · z∈[-57,-25]mm · 3 of 81 slices shown]
[im 9/81  bone]
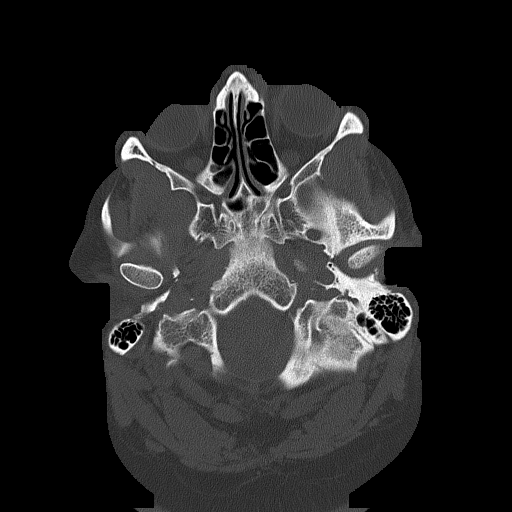
[im 17/81  bone]
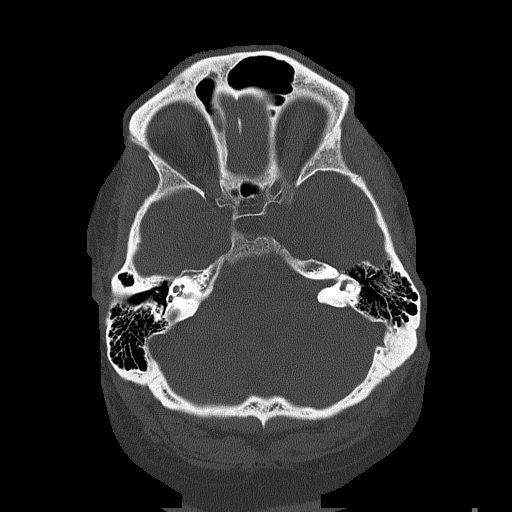
[im 25/81  bone]
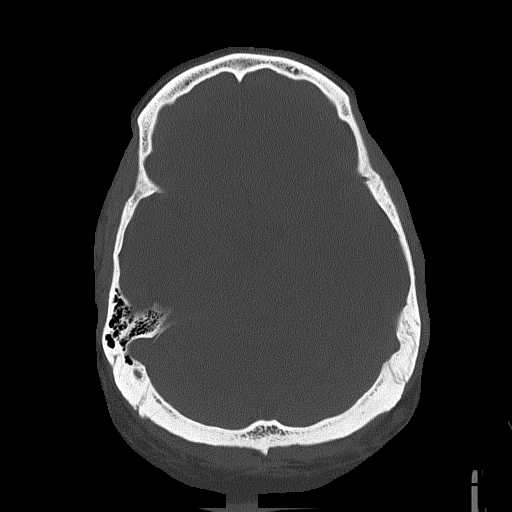

[Series 4: coronal soft · coronal · 0.39mm/px · 3 of 84 slices shown]
[im 28/84  brain]
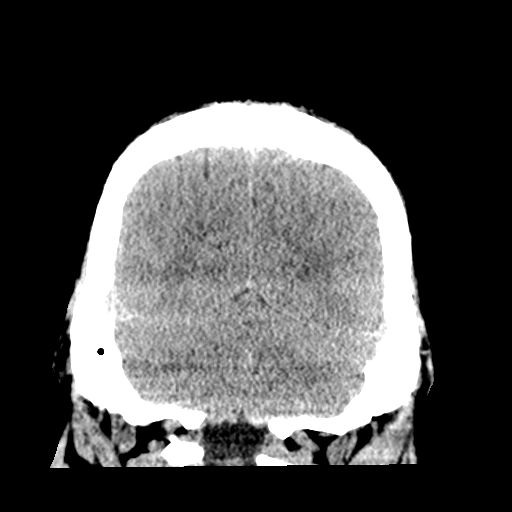
[im 37/84  brain]
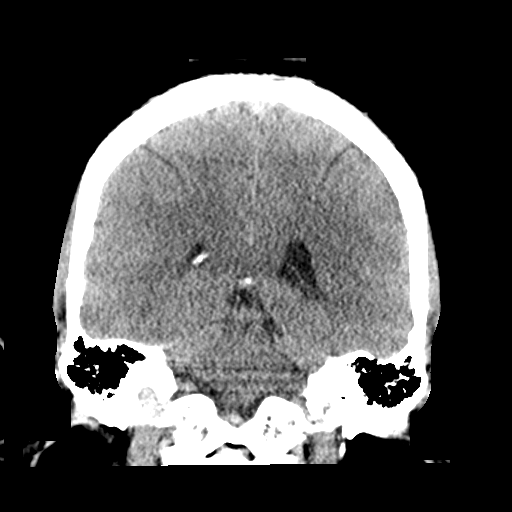
[im 47/84  brain]
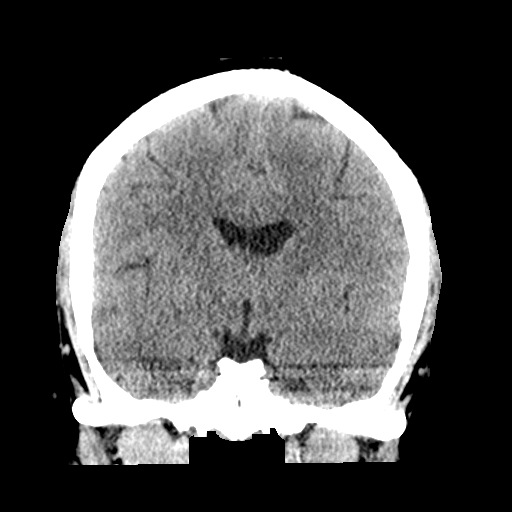

[Series 5: sagittal soft · sagittal · 0.37mm/px · 3 of 84 slices shown]
[im 28/84  brain]
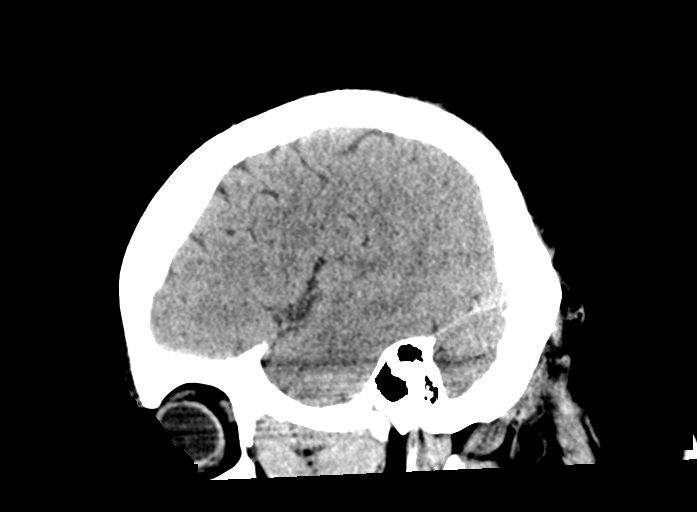
[im 42/84  brain]
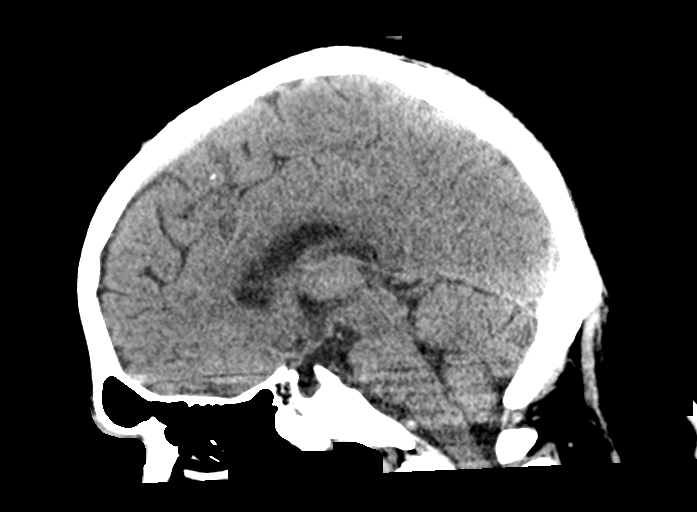
[im 56/84  brain]
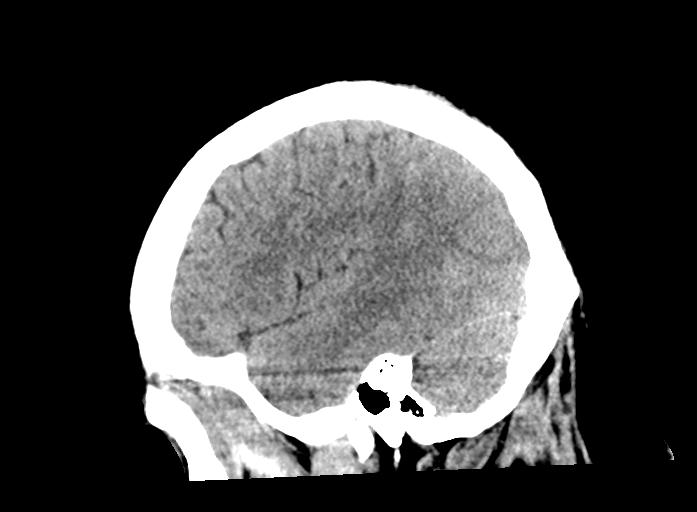

[16 of 47 positions shown; findings below may reference images not displayed]

FINDINGS: Brain: No mass lesion, hemorrhage, hydrocephalus, acute infarct,
intra-axial, or extra-axial fluid collection.

Vascular: No hyperdense vessel or unexpected calcification.

Skull: No significant soft tissue swelling.  No skull fracture.

Sinuses/Orbits: Normal imaged portions of the orbits and globes.
Hypoplastic right frontal sinus. Clear mastoid air cells.

Other: None.
IMPRESSION: Normal head CT.

## 2022-12-31 IMAGING — DX DG CHEST 1V PORT
1 series · 1 of 1 positions shown · non-contrast
Comparison: Chest radiograph dated February 01, 2022

CLINICAL DATA: Weakness, syncope episode at work.

EXAM:
PORTABLE CHEST 1 VIEW

[chest ap]
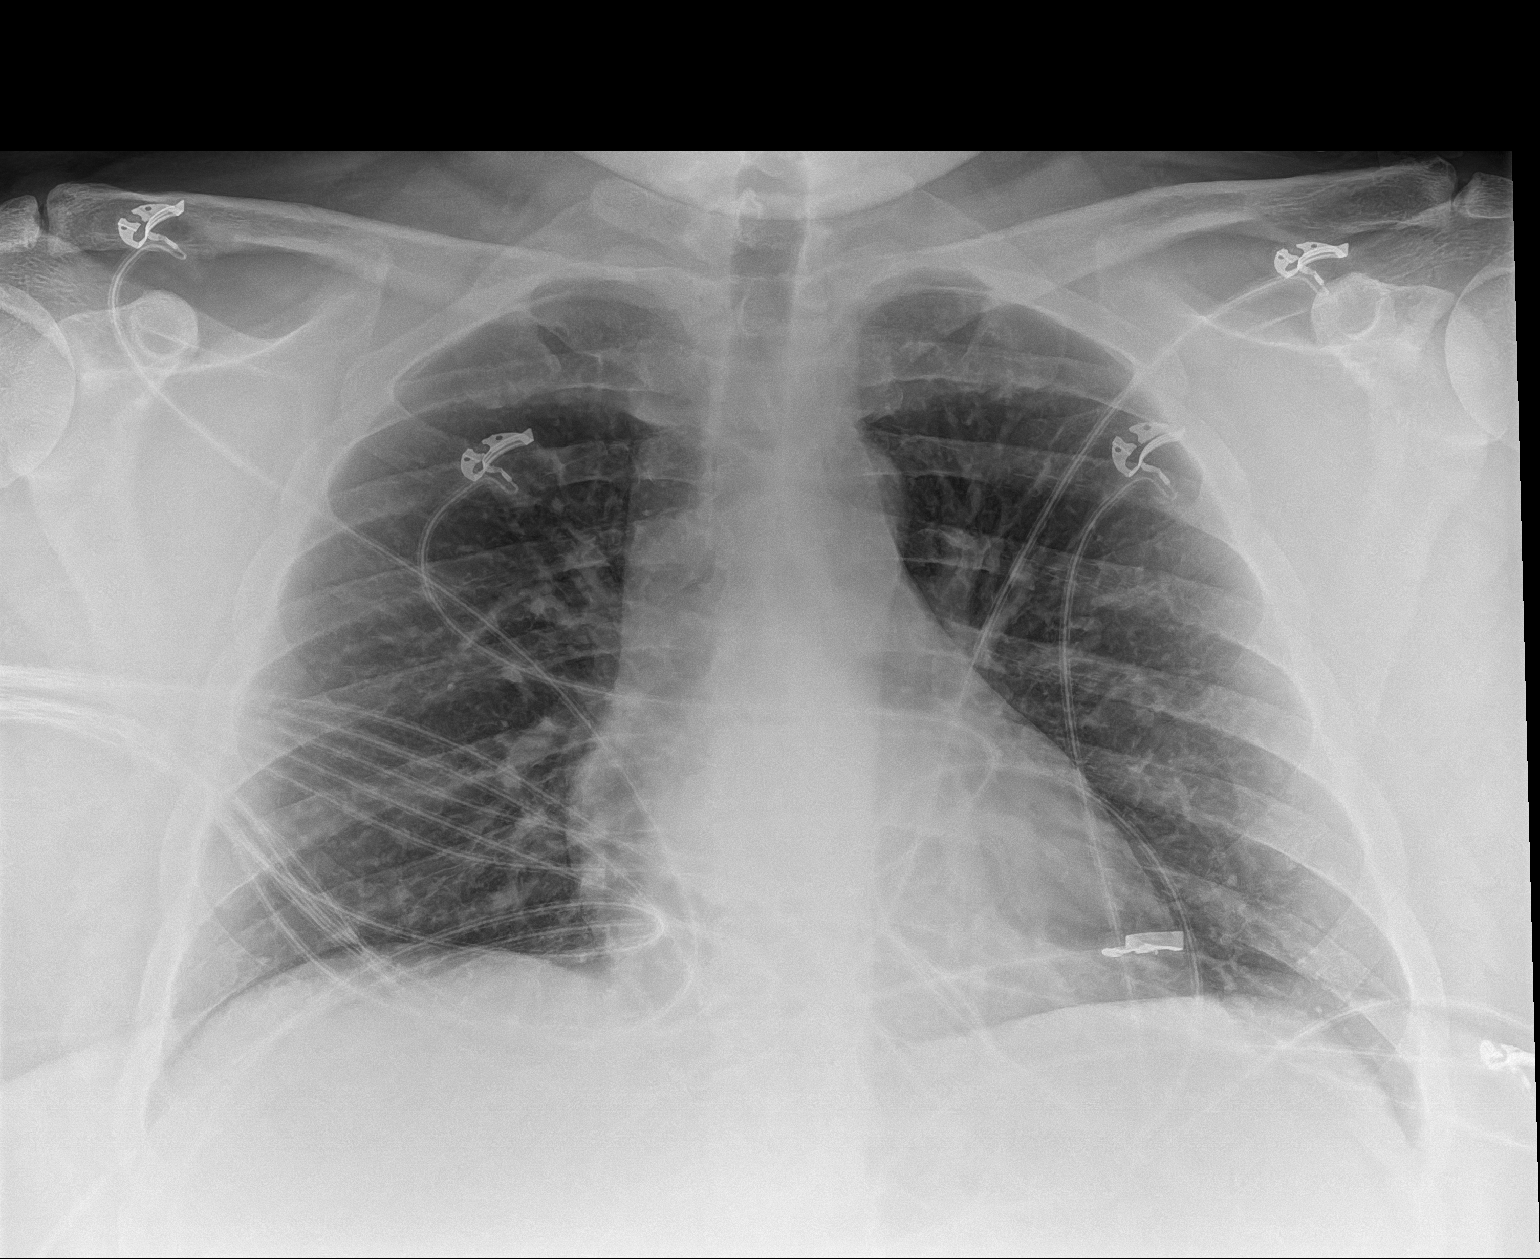

[1 of 1 positions shown; findings below may reference images not displayed]

FINDINGS: The heart size and mediastinal contours are within normal limits.
Both lungs are clear. The visualized skeletal structures are
unremarkable.
IMPRESSION: No active disease.

## 2023-01-19 NOTE — Progress Notes (Deleted)
GI Office Note    Referring Provider: [facility] [place] [facility] Primary Care Physician:  [facility] Primary Gastroenterologist: Cristopher Estimable.Rourk, MD  Date:  01/19/2023  ID:  Derek Carson, DOB [birth year], MRN [id number]   Chief Complaint   No chief complaint on file.   History of Present Illness  Derek Carson is a 42 y.o. male with a history of asthma, hypothyroidism, GERD*** presenting today with for post procedure follow up.   Hgb in September 2023: Hgb 12.2.  Iniital consultation 09/09/22. Rectal bleeding and went to ED. Given PPI and stomach gurgling stopped. After PPI he seen clots but then no more episodes at home. Not tolerated nexium in the past, symptoms not improved with omeprazole. Admitted to eating fried foods and spicy foods frequently. No PICA. Admitted to NSAIDs and aspirin due to throat discomfort. No dysphagia since starting pantoprazole 20 mg twice daily. Reported EGD in 2006 in [place]. Check CBC and iron panel. Scheduled for EGD and colonoscopy. Continue pantoprazole 20 mg BID.  Labs performed 09/17/2022: Hemoglobin 11.9, MCV 88, platelets 376, Iron 31, iron saturation 11 (advised to start oral iron therapy).  EGD 10/18/22 (Dr. Abbey Chatters given Dr. Gala Romney conflict): -gastritis s/p biopsy -normal duodenum -biopsy with oxyntic mucosa with hyperemia, negative H. Pylori -continue pantoprazole 20 mg BID  Colonoscopy 10/18/22: -non bleeding internal hemorrhoids -TI normal -exam normal -consider hemorrhoid banding if rectal bleeding continues   Today:    Current Outpatient Medications  Medication Sig Dispense Refill   albuterol (VENTOLIN HFA) 108 (90 Base) MCG/ACT inhaler Inhale 1-2 puffs into the lungs every 6 (six) hours as needed for wheezing or shortness of breath.     hydrOXYzine (ATARAX) 25 MG tablet Take 25 mg by mouth every 8 (eight) hours as needed for anxiety.     levothyroxine (SYNTHROID) 150 MCG tablet TAKE 1 TABLET ONCE DAILY  BEFORE BREAKFAST. 30 tablet 2   LORazepam (ATIVAN) 0.5 MG tablet Take 0.5 mg by mouth 2 (two) times daily as needed for anxiety.     montelukast (SINGULAIR) 10 MG tablet Take 10 mg by mouth daily.     pantoprazole (PROTONIX) 20 MG tablet TAKE (1) TABLET TWICE DAILY. 60 tablet 0   sertraline (ZOLOFT) 100 MG tablet Take 100 mg by mouth daily.     No current facility-administered medications for this visit.    Past Medical History:  Diagnosis Date   Anxiety    Asthma    Thyroid disease    hypothyroid    Past Surgical History:  Procedure Laterality Date   BIOPSY  10/18/2022   Procedure: BIOPSY;  Surgeon: Eloise Harman, DO;  [facility];  Service: Endoscopy;;   CHOLECYSTECTOMY     COLONOSCOPY WITH PROPOFOL N/A 10/18/2022   Procedure: COLONOSCOPY WITH PROPOFOL;  Surgeon: Eloise Harman, DO;  [facility];  Service: Endoscopy;  Laterality: N/A;  10:15 am   ESOPHAGOGASTRODUODENOSCOPY (EGD) WITH PROPOFOL N/A 10/18/2022   Procedure: ESOPHAGOGASTRODUODENOSCOPY (EGD) WITH PROPOFOL;  Surgeon: Eloise Harman, DO;  [facility];  Service: Endoscopy;  Laterality: N/A;   KNEE SURGERY Right     Family History  Problem Relation Age of Onset   Diabetes Mother    Diabetes Father    Hypertension Father    Heart attack Father    Heart attack Paternal Grandfather    Colon cancer Neg Hx    Colon polyps Neg Hx     Allergies as of 01/20/2023 - Review Complete  10/25/2022  Allergen Reaction Noted   Codeine Palpitations 02/07/2019   Phenergan [promethazine hcl] Rash 02/07/2019    Social History   Socioeconomic History   Marital status: Married    Spouse name: Not on file   Number of children: Not on file   Years of education: Not on file   Highest education level: Not on file  Occupational History   Not on file  Tobacco Use   Smoking status: Former    Types: Cigars   Smokeless tobacco: Not on file  Vaping Use   Vaping Use: Some days   Substance and Sexual Activity   Alcohol use: Yes    Comment: occ   Drug use: Never   Sexual activity: Yes  Other Topics Concern   Not on file  Social History Narrative   Not on file   Social Determinants of Health   Financial Resource Strain: Not on file  Food Insecurity: Not on file  Transportation Needs: Not on file  Physical Activity: Not on file  Stress: Not on file  Social Connections: Not on file     Review of Systems   Gen: Denies fever, chills, anorexia. Denies fatigue, weakness, weight loss.  CV: Denies chest pain, palpitations, syncope, peripheral edema, and claudication. Resp: Denies dyspnea at rest, cough, wheezing, coughing up blood, and pleurisy. GI: See HPI Derm: Denies rash, itching, dry skin Psych: Denies depression, anxiety, memory loss, confusion. No homicidal or suicidal ideation.  Heme: Denies bruising, bleeding, and enlarged lymph nodes.   Physical Exam   There were no vitals taken for this visit.  General:   Alert and oriented. No distress noted. Pleasant and cooperative.  Head:  Normocephalic and atraumatic. Eyes:  Conjuctiva clear without scleral icterus. Mouth:  Oral mucosa pink and moist. Good dentition. No lesions. Lungs:  Clear to auscultation bilaterally. No wheezes, rales, or rhonchi. No distress.  Heart:  S1, S2 present without murmurs appreciated.  Abdomen:  +BS, soft, non-tender and non-distended. No rebound or guarding. No HSM or masses noted. Rectal: *** Msk:  Symmetrical without gross deformities. Normal posture. Extremities:  Without edema. Neurologic:  Alert and  oriented x4 Psych:  Alert and cooperative. Normal mood and affect.   Assessment  Derek Carson is a 42 y.o. male with a history of asthma, hypothyroidism, GERD*** presenting today with for post procedure follow up.   GERD:  Anemia, rectal bleeding:    PLAN   *** Recheck CBC and iron panel Consider capsule study if Hgb  or iron levels not improved.   Continue pantoprazole 20 mg BID Avoid NSAIDs     Venetia Night, MSN, FNP-BC, AGACNP-BC [facility]

## 2023-01-20 ENCOUNTER — Ambulatory Visit: Payer: No Typology Code available for payment source | Admitting: Gastroenterology

## 2023-01-24 ENCOUNTER — Encounter: Payer: Self-pay | Admitting: Gastroenterology

## 2023-01-26 ENCOUNTER — Other Ambulatory Visit: Payer: Self-pay | Admitting: Gastroenterology

## 2023-01-26 DIAGNOSIS — K219 Gastro-esophageal reflux disease without esophagitis: Secondary | ICD-10-CM

## 2023-03-12 ENCOUNTER — Other Ambulatory Visit: Payer: Self-pay | Admitting: Gastroenterology

## 2023-03-12 DIAGNOSIS — K219 Gastro-esophageal reflux disease without esophagitis: Secondary | ICD-10-CM

## 2023-04-25 ENCOUNTER — Ambulatory Visit: Payer: No Typology Code available for payment source | Admitting: "Endocrinology

## 2023-04-29 ENCOUNTER — Other Ambulatory Visit: Payer: Self-pay | Admitting: "Endocrinology
